# Patient Record
Sex: Female | Born: 1980 | Race: Black or African American | Hispanic: No | Marital: Single | Smoking: Current some day smoker
Health system: Southern US, Community
[De-identification: ages and names within clinical notes are randomized; demographics above are authoritative.]

## PROBLEM LIST (undated history)

## (undated) ENCOUNTER — Inpatient Hospital Stay: Payer: Self-pay

## (undated) DIAGNOSIS — F99 Mental disorder, not otherwise specified: Secondary | ICD-10-CM

## (undated) DIAGNOSIS — F329 Major depressive disorder, single episode, unspecified: Secondary | ICD-10-CM

## (undated) DIAGNOSIS — F32A Depression, unspecified: Secondary | ICD-10-CM

## (undated) DIAGNOSIS — F419 Anxiety disorder, unspecified: Secondary | ICD-10-CM

## (undated) DIAGNOSIS — D649 Anemia, unspecified: Secondary | ICD-10-CM

## (undated) HISTORY — DX: Anemia, unspecified: D64.9

## (undated) HISTORY — PX: SALPINGECTOMY: SHX328

---

## 2016-09-30 ENCOUNTER — Encounter (HOSPITAL_COMMUNITY): Payer: Self-pay | Admitting: Emergency Medicine

## 2016-09-30 ENCOUNTER — Emergency Department (HOSPITAL_COMMUNITY)
Admission: EM | Admit: 2016-09-30 | Discharge: 2016-09-30 | Disposition: A | Discharge status: Home / Self Care | Payer: Self-pay | Attending: Emergency Medicine | Admitting: Emergency Medicine

## 2016-09-30 DIAGNOSIS — Z7951 Long term (current) use of inhaled steroids: Secondary | ICD-10-CM | POA: Insufficient documentation

## 2016-09-30 DIAGNOSIS — Z79899 Other long term (current) drug therapy: Secondary | ICD-10-CM | POA: Insufficient documentation

## 2016-09-30 DIAGNOSIS — J45901 Unspecified asthma with (acute) exacerbation: Secondary | ICD-10-CM

## 2016-09-30 DIAGNOSIS — N739 Female pelvic inflammatory disease, unspecified: Secondary | ICD-10-CM

## 2016-09-30 DIAGNOSIS — F17200 Nicotine dependence, unspecified, uncomplicated: Secondary | ICD-10-CM | POA: Insufficient documentation

## 2016-09-30 LAB — URINALYSIS, ROUTINE W REFLEX MICROSCOPIC
Bilirubin Urine: NEGATIVE
GLUCOSE, UA: NEGATIVE mg/dL
Hgb urine dipstick: NEGATIVE
Ketones, ur: 20 mg/dL — AB
LEUKOCYTES UA: NEGATIVE
Nitrite: NEGATIVE
PH: 6 (ref 5.0–8.0)
Protein, ur: NEGATIVE mg/dL
Specific Gravity, Urine: 1.025 (ref 1.005–1.030)

## 2016-09-30 LAB — COMPREHENSIVE METABOLIC PANEL
ALT: 25 U/L (ref 14–54)
AST: 15 U/L (ref 15–41)
Albumin: 4 g/dL (ref 3.5–5.0)
Alkaline Phosphatase: 57 U/L (ref 38–126)
Anion gap: 7 (ref 5–15)
BUN: 6 mg/dL (ref 6–20)
CHLORIDE: 106 mmol/L (ref 101–111)
CO2: 25 mmol/L (ref 22–32)
Calcium: 8.7 mg/dL — ABNORMAL LOW (ref 8.9–10.3)
Creatinine, Ser: 0.69 mg/dL (ref 0.44–1.00)
GFR calc Af Amer: >60 mL/min (ref >60)
Glucose, Bld: 91 mg/dL (ref 65–99)
POTASSIUM: 3.3 mmol/L — AB (ref 3.5–5.1)
SODIUM: 138 mmol/L (ref 135–145)
Total Bilirubin: 0.8 mg/dL (ref 0.3–1.2)
Total Protein: 6.9 g/dL (ref 6.5–8.1)

## 2016-09-30 LAB — LIPASE, BLOOD: LIPASE: 24 U/L (ref 11–51)

## 2016-09-30 LAB — CBC
HEMATOCRIT: 33.9 % — AB (ref 36.0–46.0)
Hemoglobin: 12.1 g/dL (ref 12.0–15.0)
MCH: 31.1 pg (ref 26.0–34.0)
MCHC: 35.7 g/dL (ref 30.0–36.0)
MCV: 87.1 fL (ref 78.0–100.0)
Platelets: 268 10*3/uL (ref 150–400)
RBC: 3.89 MIL/uL (ref 3.87–5.11)
RDW: 12.8 % (ref 11.5–15.5)
WBC: 8.2 10*3/uL (ref 4.0–10.5)

## 2016-09-30 MED ORDER — ALBUTEROL SULFATE HFA 108 (90 BASE) MCG/ACT IN AERS: 2.0000 | INHALATION_SPRAY | Freq: Four times a day (QID) | RESPIRATORY_TRACT | 2 refills | Status: DC | PRN

## 2016-09-30 MED ORDER — PREDNISONE 10 MG PO TABS: 60.0000 mg | ORAL_TABLET | Freq: Every day | ORAL | 0 refills | Status: DC

## 2016-09-30 MED ORDER — PREDNISONE 20 MG PO TABS
60.0000 mg | ORAL_TABLET | Freq: Once | ORAL | Status: AC
  Administered 2016-09-30: 60 mg via ORAL
  Filled 2016-09-30: qty 3

## 2016-09-30 MED ORDER — ONDANSETRON 4 MG PO TBDP
4.0000 mg | ORAL_TABLET | Freq: Once | ORAL | Status: AC
  Administered 2016-09-30: 4 mg via ORAL

## 2016-09-30 MED ORDER — DOXYCYCLINE HYCLATE 100 MG PO CAPS: 100.0000 mg | ORAL_CAPSULE | Freq: Two times a day (BID) | ORAL | 0 refills | Status: DC

## 2016-09-30 MED ORDER — ONDANSETRON 4 MG PO TBDP
ORAL_TABLET | ORAL | Status: AC
  Filled 2016-09-30: qty 1

## 2016-09-30 MED ORDER — IPRATROPIUM-ALBUTEROL 0.5-2.5 (3) MG/3ML IN SOLN
5.0000 mL | Freq: Once | RESPIRATORY_TRACT | Status: AC
  Administered 2016-09-30: 5 mL via RESPIRATORY_TRACT
  Filled 2016-09-30: qty 3

## 2016-09-30 NOTE — Discharge Instructions (Signed)
Take the antibiotics prescribed - complete the course even if you get better as it will cover you for the lower body infection.  Protected sex only for now.  We saw you in the ER for your asthma related complains. We gave you some breathing treatments in the ER, and seems like your symptoms have improved. Please take albuterol as needed every 4 hours. Please take the medications prescribed. Please refrain from smoking or smoke exposure. Please see a primary care doctor in 1 week. Return to the ER if your symptoms worsen.

## 2016-09-30 NOTE — ED Triage Notes (Signed)
Patient here after ingesting Zithromax for STD earlier today, now with abdominal pain, feeling weak.  She states that she has had an URI.

## 2016-09-30 NOTE — ED Provider Notes (Signed)
[facility] Provider Note   CSN: [id number] Arrival date & time: 09/30/16  0240     History   Chief Complaint Chief Complaint  Patient presents with  . Abdominal Pain    HPI Jacqueline Diaz is a 36 y.o. female.  HPI  SUBJECTIVE:  Jacqueline Diaz is a 36 y.o. female seen urgently with exacerbation of asthma for 3 days. Wheezing is described as mild-moderate. Associated symptoms:congestion, nasal blockage, post nasal drip, productive cough, cough described as productive of yellow sputum and myalgias. Current asthma medications: Proventil MDI (or generic equivalent). Patient has smoke cigarettes.  Pt also c/o abd pain. She was diagnosed with STD and given azithromycin - but she threw it up. She has no new vaginal discharge, pain. Pt h  OBJECTIVE:  The patient appears alert, well appearing, and in no distress, oriented to person, place, and time, normal appearing weight and acyanotic, in no respiratory distress. ENT: ENT exam normal, no neck nodes or sinus tenderness CHEST:clear to auscultation, + wheezes, no rales or rhonchi, symmetric air entry  ASSESSMENT:  Asthma - acute exacerbation due to viral URI  History reviewed. No pertinent past medical history.  There are no active problems to display for this patient.   History reviewed. No pertinent surgical history.  OB History    No data available       Home Medications    Prior to Admission medications   Medication Sig Start Date End Date Taking? Authorizing Provider  albuterol (PROVENTIL HFA;VENTOLIN HFA) 108 (90 Base) MCG/ACT inhaler Inhale 2 puffs into the lungs every 6 (six) hours as needed for wheezing or shortness of breath. 09/30/16   Derwood Kaplan, MD  doxycycline (VIBRAMYCIN) 100 MG capsule Take 1 capsule (100 mg total) by mouth 2 (two) times daily. 09/30/16   Derwood Kaplan, MD  predniSONE (DELTASONE) 10 MG tablet Take 6 tablets (60 mg total) by mouth daily. 09/30/16   Derwood Kaplan, MD     Family History No family history on file.  Social History Social History  Substance Use Topics  . Smoking status: Current Some Day Smoker  . Smokeless tobacco: Never Used  . Alcohol use No     Allergies   Flagyl [metronidazole] and Morphine and related   Review of Systems Review of Systems  HENT: Positive for sinus pressure and voice change.   Respiratory: Positive for cough, shortness of breath and wheezing.   Gastrointestinal: Positive for abdominal pain.  Allergic/Immunologic: Negative for immunocompromised state.  All other systems reviewed and are negative.    Physical Exam Updated Vital Signs BP 125/84 (BP Location: Right Arm)   Pulse 91   Temp 98.5 F (36.9 C) (Oral)   Resp 16   Ht 5\' 3"  (1.6 m)   Wt 71.7 kg (158 lb)   LMP 09/22/2016 (Approximate)   SpO2 100%   BMI 27.99 kg/m   Physical Exam  Constitutional: She is oriented to person, place, and time. She appears well-developed and well-nourished.  HENT:  Head: Normocephalic and atraumatic.  Eyes: EOM are normal. Pupils are equal, round, and reactive to light.  Neck: Neck supple.  Cardiovascular: Normal rate, regular rhythm and normal heart sounds.   No murmur heard. Pulmonary/Chest: Effort normal. No respiratory distress. She has wheezes.  Abdominal: Soft. She exhibits no distension. There is no tenderness. There is no rebound and no guarding.  Neurological: She is alert and oriented to person, place, and time.  Skin: Skin is warm and dry.  Nursing note and vitals  reviewed.    ED Treatments / Results  Labs (all labs ordered are listed, but only abnormal results are displayed) Labs Reviewed  COMPREHENSIVE METABOLIC PANEL - Abnormal; Notable for the following:       Result Value   Potassium 3.3 (*)    Calcium 8.7 (*)    All other components within normal limits  CBC - Abnormal; Notable for the following:    HCT 33.9 (*)    All other components within normal limits  URINALYSIS, ROUTINE W  REFLEX MICROSCOPIC - Abnormal; Notable for the following:    APPearance HAZY (*)    Ketones, ur 20 (*)    All other components within normal limits  LIPASE, BLOOD    EKG  EKG Interpretation None       Radiology No results found.  Procedures Procedures (including critical care time)  Medications Ordered in ED Medications  ondansetron (ZOFRAN-ODT) disintegrating tablet 4 mg (4 mg Oral Given 09/30/16 0304)  ipratropium-albuterol (DUONEB) 0.5-2.5 (3) MG/3ML nebulizer solution 5 mL (5 mLs Nebulization Given 09/30/16 0643)  predniSONE (DELTASONE) tablet 60 mg (60 mg Oral Given 09/30/16 [id number])     Initial Impression / Assessment and Plan / ED Course  I have reviewed the triage vital signs and the nursing notes.  Pertinent labs & imaging results that were available during my care of the patient were reviewed by me and considered in my medical decision making (see chart for details).       Final Clinical Impressions(s) / ED Diagnoses   Final diagnoses:  Moderate asthma with exacerbation, unspecified whether persistent  Pelvic infection in female    New Prescriptions New Prescriptions   ALBUTEROL (PROVENTIL HFA;VENTOLIN HFA) 108 (90 BASE) MCG/ACT INHALER    Inhale 2 puffs into the lungs every 6 (six) hours as needed for wheezing or shortness of breath.   DOXYCYCLINE (VIBRAMYCIN) 100 MG CAPSULE    Take 1 capsule (100 mg total) by mouth 2 (two) times daily.   PREDNISONE (DELTASONE) 10 MG TABLET    Take 6 tablets (60 mg total) by mouth daily.     Derwood KaplanNanavati, Shayleen Eppinger, MD 09/30/16 [phone]

## 2016-09-30 NOTE — ED Notes (Signed)
Pt given 2 ginger ales, one for her and for her man visitor.

## 2017-07-08 ENCOUNTER — Other Ambulatory Visit: Payer: Self-pay

## 2017-07-08 ENCOUNTER — Inpatient Hospital Stay
Admission: EM | Admit: 2017-07-08 | Discharge: 2017-07-08 | Disposition: A | Discharge status: Other Acute Inpatient Hospital | Payer: Medicaid Other | Attending: Obstetrics and Gynecology | Admitting: Obstetrics and Gynecology

## 2017-07-08 ENCOUNTER — Inpatient Hospital Stay
Admission: AD | Admit: 2017-07-08 | Discharge: 2017-07-09 | DRG: 832 | Disposition: A | Discharge status: Home / Self Care | Payer: No Typology Code available for payment source | Source: Intra-hospital | Attending: Psychiatry | Admitting: Psychiatry

## 2017-07-08 DIAGNOSIS — G4700 Insomnia, unspecified: Secondary | ICD-10-CM | POA: Diagnosis present

## 2017-07-08 DIAGNOSIS — R45851 Suicidal ideations: Secondary | ICD-10-CM | POA: Diagnosis not present

## 2017-07-08 DIAGNOSIS — O98313 Other infections with a predominantly sexual mode of transmission complicating pregnancy, third trimester: Secondary | ICD-10-CM | POA: Diagnosis not present

## 2017-07-08 DIAGNOSIS — Z62810 Personal history of physical and sexual abuse in childhood: Secondary | ICD-10-CM | POA: Diagnosis present

## 2017-07-08 DIAGNOSIS — O26893 Other specified pregnancy related conditions, third trimester: Secondary | ICD-10-CM | POA: Diagnosis not present

## 2017-07-08 DIAGNOSIS — F419 Anxiety disorder, unspecified: Secondary | ICD-10-CM | POA: Diagnosis present

## 2017-07-08 DIAGNOSIS — Z888 Allergy status to other drugs, medicaments and biological substances status: Secondary | ICD-10-CM

## 2017-07-08 DIAGNOSIS — A599 Trichomoniasis, unspecified: Secondary | ICD-10-CM | POA: Insufficient documentation

## 2017-07-08 DIAGNOSIS — O09213 Supervision of pregnancy with history of pre-term labor, third trimester: Secondary | ICD-10-CM | POA: Insufficient documentation

## 2017-07-08 DIAGNOSIS — O99333 Smoking (tobacco) complicating pregnancy, third trimester: Secondary | ICD-10-CM | POA: Insufficient documentation

## 2017-07-08 DIAGNOSIS — O09523 Supervision of elderly multigravida, third trimester: Secondary | ICD-10-CM | POA: Insufficient documentation

## 2017-07-08 DIAGNOSIS — R1030 Lower abdominal pain, unspecified: Secondary | ICD-10-CM | POA: Diagnosis present

## 2017-07-08 DIAGNOSIS — F333 Major depressive disorder, recurrent, severe with psychotic symptoms: Secondary | ICD-10-CM | POA: Diagnosis present

## 2017-07-08 DIAGNOSIS — O99343 Other mental disorders complicating pregnancy, third trimester: Principal | ICD-10-CM | POA: Diagnosis present

## 2017-07-08 DIAGNOSIS — Z915 Personal history of self-harm: Secondary | ICD-10-CM | POA: Diagnosis not present

## 2017-07-08 DIAGNOSIS — Z885 Allergy status to narcotic agent status: Secondary | ICD-10-CM

## 2017-07-08 DIAGNOSIS — D649 Anemia, unspecified: Secondary | ICD-10-CM | POA: Insufficient documentation

## 2017-07-08 DIAGNOSIS — Z87440 Personal history of urinary (tract) infections: Secondary | ICD-10-CM | POA: Insufficient documentation

## 2017-07-08 DIAGNOSIS — O99353 Diseases of the nervous system complicating pregnancy, third trimester: Secondary | ICD-10-CM | POA: Diagnosis present

## 2017-07-08 DIAGNOSIS — O99013 Anemia complicating pregnancy, third trimester: Secondary | ICD-10-CM | POA: Diagnosis not present

## 2017-07-08 DIAGNOSIS — F17200 Nicotine dependence, unspecified, uncomplicated: Secondary | ICD-10-CM | POA: Diagnosis present

## 2017-07-08 DIAGNOSIS — A5901 Trichomonal vulvovaginitis: Secondary | ICD-10-CM

## 2017-07-08 DIAGNOSIS — Z3A29 29 weeks gestation of pregnancy: Secondary | ICD-10-CM | POA: Insufficient documentation

## 2017-07-08 DIAGNOSIS — O0933 Supervision of pregnancy with insufficient antenatal care, third trimester: Secondary | ICD-10-CM | POA: Diagnosis not present

## 2017-07-08 DIAGNOSIS — R109 Unspecified abdominal pain: Secondary | ICD-10-CM

## 2017-07-08 DIAGNOSIS — Z818 Family history of other mental and behavioral disorders: Secondary | ICD-10-CM | POA: Diagnosis not present

## 2017-07-08 DIAGNOSIS — Z813 Family history of other psychoactive substance abuse and dependence: Secondary | ICD-10-CM | POA: Diagnosis not present

## 2017-07-08 HISTORY — DX: Mental disorder, not otherwise specified: F99

## 2017-07-08 HISTORY — DX: Major depressive disorder, single episode, unspecified: F32.9

## 2017-07-08 HISTORY — DX: Anxiety disorder, unspecified: F41.9

## 2017-07-08 HISTORY — DX: Depression, unspecified: F32.A

## 2017-07-08 LAB — URINALYSIS, COMPLETE (UACMP) WITH MICROSCOPIC
BACTERIA UA: NONE SEEN
Bilirubin Urine: NEGATIVE
Glucose, UA: NEGATIVE mg/dL
Hgb urine dipstick: NEGATIVE
KETONES UR: NEGATIVE mg/dL
NITRITE: NEGATIVE
PH: 7 (ref 5.0–8.0)
Protein, ur: NEGATIVE mg/dL
SPECIFIC GRAVITY, URINE: 1.013 (ref 1.005–1.030)

## 2017-07-08 LAB — URINE DRUG SCREEN, QUALITATIVE (ARMC ONLY)
AMPHETAMINES, UR SCREEN: NOT DETECTED
BENZODIAZEPINE, UR SCRN: NOT DETECTED
Barbiturates, Ur Screen: NOT DETECTED
Cannabinoid 50 Ng, Ur ~~LOC~~: POSITIVE — AB
Cocaine Metabolite,Ur ~~LOC~~: NOT DETECTED
MDMA (Ecstasy)Ur Screen: NOT DETECTED
METHADONE SCREEN, URINE: NOT DETECTED
Opiate, Ur Screen: NOT DETECTED
Phencyclidine (PCP) Ur S: NOT DETECTED
Tricyclic, Ur Screen: NOT DETECTED

## 2017-07-08 LAB — CBC WITH DIFFERENTIAL/PLATELET
BASOS ABS: 0 10*3/uL (ref 0–0.1)
Basophils Relative: 0 %
EOS PCT: 2 %
Eosinophils Absolute: 0.1 10*3/uL (ref 0–0.7)
HEMATOCRIT: 28.4 % — AB (ref 35.0–47.0)
HEMOGLOBIN: 9.8 g/dL — AB (ref 12.0–16.0)
LYMPHS ABS: 1.5 10*3/uL (ref 1.0–3.6)
LYMPHS PCT: 18 %
MCH: 32.7 pg (ref 26.0–34.0)
MCHC: 34.6 g/dL (ref 32.0–36.0)
MCV: 94.5 fL (ref 80.0–100.0)
Monocytes Absolute: 0.6 10*3/uL (ref 0.2–0.9)
Monocytes Relative: 8 %
NEUTROS ABS: 6.1 10*3/uL (ref 1.4–6.5)
NEUTROS PCT: 72 %
PLATELETS: 219 10*3/uL (ref 150–440)
RBC: 3 MIL/uL — AB (ref 3.80–5.20)
RDW: 12.5 % (ref 11.5–14.5)
WBC: 8.4 10*3/uL (ref 3.6–11.0)

## 2017-07-08 LAB — CHLAMYDIA/NGC RT PCR (ARMC ONLY)
CHLAMYDIA TR: NOT DETECTED
N GONORRHOEAE: NOT DETECTED

## 2017-07-08 LAB — HEMOGLOBIN A1C
Hgb A1c MFr Bld: 3.6 % — ABNORMAL LOW (ref 4.8–5.6)
Mean Plasma Glucose: 56.62 mg/dL

## 2017-07-08 MED ORDER — PRENATAL MULTIVITAMIN CH
1.0000 | ORAL_TABLET | Freq: Every day | ORAL | Status: DC
  Administered 2017-07-09: 1 via ORAL
  Filled 2017-07-08: qty 1

## 2017-07-08 MED ORDER — QUETIAPINE FUMARATE 25 MG PO TABS
50.0000 mg | ORAL_TABLET | Freq: Every day | ORAL | Status: DC
  Administered 2017-07-08: 50 mg via ORAL
  Filled 2017-07-08: qty 2

## 2017-07-08 MED ORDER — CALCIUM CARBONATE ANTACID 500 MG PO CHEW
CHEWABLE_TABLET | ORAL | Status: AC
  Filled 2017-07-08: qty 2

## 2017-07-08 MED ORDER — METRONIDAZOLE 500 MG PO TABS
2000.0000 mg | ORAL_TABLET | Freq: Once | ORAL | Status: AC
  Administered 2017-07-08: 2000 mg via ORAL
  Filled 2017-07-08: qty 4

## 2017-07-08 MED ORDER — CALCIUM CARBONATE ANTACID 500 MG PO CHEW
2.0000 | CHEWABLE_TABLET | Freq: Every day | ORAL | Status: DC
  Administered 2017-07-08: 400 mg via ORAL

## 2017-07-08 NOTE — Progress Notes (Signed)
Doppler fetal heart tone obtained, 144bpm. Will report to [facility] nurse.

## 2017-07-08 NOTE — Progress Notes (Signed)
Alan Mulderalled Stephanie, [facility] to [facility] and possible need of sitter in observation room on labor and delivery unit if patient is not to be admitted this shift due to patient presenting with SI and extensive mental health history. Judeth CornfieldStephanie, [facility] returned phone call regarding [facility] is ready for the admission. Placed phone call to BMU [phone]), no answer at this time.
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
## 2017-07-08 NOTE — Progress Notes (Signed)
[facility] responded to an order requisition for suicidal thoughts. [facility] arrived and patient was sitting in a dark room curled up in the bed. Patient stated that she depressed and dealing with loss and grieving. Patient thought about having an abortion as the father is not in the picture any longer. Patient's daughter is also pregnant and due a month before. Patient stated that this is not the right time as she is living with her sister and needs to figure her living situation out and get "right". CH provided emotional support, listening ear, and prayer. Patient wants to be left alone and stay one night on other unit before needing a way to get back to her sisters. Patient is staying in [place] but is from [place] area.

## 2017-07-08 NOTE — Progress Notes (Signed)
Report called to Trinna PostAlex, RN [facility]. Nurse verbalized understanding of report given. Pt to be admitted voluntarily to [facility]. Will transport patient via wheelchair to the Behavioral Medicine Unit.

## 2017-07-08 NOTE — Clinical Social Work Note (Signed)
CSW received consult for multiple resource needs. Psychiatry consult was placed due to patient admitting to suicidal ideation. CSW will defer assessment until psychiatry has seen patient. York Spaniel MSW,LCSW [phone]

## 2017-07-08 NOTE — Tx Team (Signed)
Initial Treatment Plan 07/08/2017 11:46 PM Jacqueline AweLatosha Domingos ZOX:[id number]    PATIENT STRESSORS: Financial difficulties Health problems Marital or family conflict Substance abuse   PATIENT STRENGTHS: Capable of independent living Communication skills Motivation for treatment/growth Religious Affiliation   PATIENT IDENTIFIED PROBLEMS: Depression/Anxiety    Bipolar , Schizophrenia    Suicidal     Marijuana use         DISCHARGE CRITERIA:  Adequate post-discharge living arrangements Improved stabilization in mood, thinking, and/or behavior Medical problems require only outpatient monitoring Motivation to continue treatment in a less acute level of care Need for constant or close observation no longer present Reduction of life-threatening or endangering symptoms to within safe limits  PRELIMINARY DISCHARGE PLAN: Outpatient therapy Participate in family therapy Placement in alternative living arrangements Return to previous living arrangement  PATIENT/FAMILY INVOLVEMENT: This treatment plan has been presented to and reviewed with the patient, Jacqueline Diaz,.  The patient  have been given the opportunity to ask questions and make suggestions.  Lelan PonsAlexander  Markelle Asaro, RN 07/08/2017, 11:46 PM

## 2017-07-08 NOTE — Clinical Social Work Note (Signed)
Patient's nurse contacted CSW and informed that psychiatry has seen patient and patient will be [facility]. [place]. [place] SpanielMonica Langdon Crosson MSW,LCSW [phone]

## 2017-07-08 NOTE — Progress Notes (Addendum)
Pt arrives to unit via EMS. Pt presents with abdominal pain q265min, vaginal irritation, and L sided lower back pain.   Triaging patient and patient begins to cry and states that she is depressed. I sit with the patient and listen to her concerns and worries/what makes her depressed. The patient spoke to me and said that she was stressed and overwhelmed. She stated that she was sad because she lost custody of her two youngest kids in [id number] when she was in prison. She states she was in prison for "threatening her social worker". She said she was there 5 months where she was diagnosed with syphilis and treated with 3 doses of PCN. She states "her oldest 2 children are 5816 and 39 years old. The 39 year old lives in [place] with his dad and the 39 year old live in [place] and is pregnant too". States that she is also sad because she has no support other than her mother, who lives in [place], and her dad passed away in 2013. I asked patient if she had any thoughts of harming herself and she said "Yes, I have felt like that the past several days". When I asked if she had a thought, plan, or attempt to commit suicide, she stated that she had thoughts of harming herself. When I asked if she had a plan, she said that she wanted to "cut herself and bleed out". I [facility] and put in a psych consult. Spoke to Dr. Johnella MoloneyMcNew, MD for psych and discussed plan of care.  She moved from [place] to [place] and currently lives with her sister, who works in a nursing facility. She states she has not had prenatal care since she left [place] at 16 weeks. She was getting [facility] at [facility]. She states she doesn't really know why she hasn't reached out to receive care here in [place]. She stated that she delivered all of her other children there and they were all vaginal deliveries.   I asked patient if she had transportation, food, etc. She stated she "got groceries when she first moved here". I asked if  she had gotten groceries since then, she said she got some a while ago from her mom when she came to [place] area. Pt states she has no transportation anywhere.  Pt states she has "depression, anxiety, bipolar disorder, and schizophrenia". Pt states she was told she was anemic and would need iron transfusions this pregnancy. Pt states she has had hx of syphilis, trichomonas, and was recently hospitalized [facility]. Pt states she received many antibiotics while in the hospital, but has not resumed taking her antibiotics at home.   During assessment, pt complains of vaginal irritation-- itching and "fishy odor". She states she had sex last week and that symptoms showed up several days ago. Provider performed wet prep and pt was diagnosed with trichomonas and bacterial vaginosis. Provider ordered Flagyl 2000 mg. Pt states she has had Flagyl in past and did not have reaction like she once did. Pt complains of left, lower back pain. Provider assessed and sent U/A. Labs came back WNL. Pt states she smoked marijuana yesterday and used cocaine prior to pregnancy. UDS positive for maijuana. Provider has been at bedside and educated patient regarding plan of care.

## 2017-07-08 NOTE — Progress Notes (Signed)
Called [facility] and spoke to charge nurse per MD request. Charge nurse states they would come get pt [facility] at the beginning of the next shift.

## 2017-07-08 NOTE — BH Assessment (Signed)
Patient is to be admitted to [facility] by Dr. Johnella MoloneyMcNew.  Attending Physician will be Dr. Johnella MoloneyMcNew.   Patient has been assigned to room 310, by [facility] Nurse Gwen.   Intake Paper Work has been signed and placed on patient chart.    Jacqueline Diaz, Patient's Nurse   Mertie ClauseJeanelle, Patient Access.

## 2017-07-08 NOTE — Discharge Summary (Signed)
L&D OB Triage Note  SUBJECTIVE Jacqueline Diaz is a 37 y.o. [id number] female at 1721w1d, EDD Estimated Date of Delivery: 09/22/17 who presented to triage with complaints of lower abdominal pain, vaginal discharge with irritation, no prenatal care for the last 15 weeks.  She was originally [facility] at [facility] but has now moved to this area and has not had any prenatal care or sought prenatal care.  Her obstetric history is complicated by a prior preterm birth.  Within the last year she has been treated for syphilis and trichomonas.  She was hospitalized during this pregnancy for pyelonephritis but admits she is not currently taking her prescribed antibiotics.  She admits to the use of marijuana.  Upon further discussion she has a difficult living situation, lack of food to eat and transportation issues restricting her care.  She also admits to thoughts of self-harm and have worsened in the last few days..   OB History  Gravida Para Term Preterm AB Living  6 4 3 1 1 4   SAB TAB Ectopic Multiple Live Births  0 0 1 0 4    # Outcome Date GA Lbr Len/2nd Weight Sex Delivery Anes PTL Lv  6 Current           5 Preterm 02/15/13     Vag-Spont   LIV  4 Term 04/18/11    F Vag-Spont   LIV  3 Ectopic 2007          2 Term 04/03/01   8 lb 2 oz (3.685 kg)  Vag-Spont   LIV  1 Term 09/1995     Vag-Spont   LIV    Medications Prior to Admission  Medication Sig Dispense Refill Last Dose  . Prenatal Vit-Fe Fumarate-FA (MULTIVITAMIN-PRENATAL) 27-0.8 MG TABS tablet Take 1 tablet by mouth daily at 12 noon.   Past Week at Unknown time  . albuterol (PROVENTIL HFA;VENTOLIN HFA) 108 (90 Base) MCG/ACT inhaler Inhale 2 puffs into the lungs every 6 (six) hours as needed for wheezing or shortness of breath. (Patient not taking: Reported on 07/08/2017) 1 Inhaler 2 Not Taking at Unknown time  . doxycycline (VIBRAMYCIN) 100 MG capsule Take 1 capsule (100 mg total) by mouth 2 (two) times daily. (Patient not taking: Reported on  07/08/2017) 14 capsule 0 Not Taking at Unknown time  . predniSONE (DELTASONE) 10 MG tablet Take 6 tablets (60 mg total) by mouth daily. (Patient not taking: Reported on 07/08/2017) 18 tablet 0 Not Taking at Unknown time        OBJECTIVE  Nursing Evaluation:   BP (!) 97/58 (BP Location: Left Arm)   Pulse 86   Temp 98.2 F (36.8 C) (Oral)   Resp 18   Ht 5\' 4"  (1.626 m)   Wt 159 lb (72.1 kg)   LMP 09/22/2016 (Approximate)   BMI 27.29 kg/m    Findings:   U/A -no evidence of UTI.     Tox screen positive for MJ     Wet prep showing trichomonas and BV     Patient with rare irregular contractions-not in labor.     Anemia     RPR and GC/CT hemoglobin A1c pending.  NST was performed and has been reviewed by me.  NST INTERPRETATION: Category I  Mode: External Baseline Rate (A): 155 bpm(fht) Variability: Moderate Accelerations: 10 x 10 Decelerations: Variable     Contraction Frequency (min): 10-11  WET PREP: clue cells: present, KOH (yeast): negative, odor: present and trichomoniasis: positive  ASSESSMENT Impression:  1.  Pregnancy:  [id number] at 109w1d , EDD Estimated Date of Delivery: 09/22/17   -  history of preterm birth-  2.  NST:  Category I   consistent with gestational age. 3.  History of  pyelonephritis during pregnancy not on prophylaxis. 4.  Trichomonas 5.  Patient with self-harm/suicidal ideation. 6.  No prenatal care in the second trimester. 7.  Anemia 8.  Recent incarceration 9.  History of positive RPR with treatment documented in the last year without subsequent test of cure.  PLAN 1.   A psychiatry consult was obtained and they agreed that self-harm was a possibility.  Once she is discharged from labor and delivery she will be direct admitted to psychiatry.  The patient has agreed to this admission. 2.  Stat 2 g dose of Flagyl for trichomoniasis and BV given. 3.  Recommend  oral iron twice daily with meals. 4.  Macrobid 1 p.o. daily for the remainder of  the pregnancy for prophylaxis - history of pyelonephritis 5.  Prenatal vitamins daily 6.  Check RPR, GC/CT, hemoglobin A1c during this admission. 7.  Resume prenatal care when discharged   ?[facility] 8.  Recommend social service consult prior to discharge   I spent 45 minutes with this patient of which greater than 50% was spent in direct patient care in labor and delivery unit.  This included a review of her history and discussion with her about this history, recommendation for future care, evaluation of self-harm, discussion regarding STDs.

## 2017-07-09 DIAGNOSIS — F333 Major depressive disorder, recurrent, severe with psychotic symptoms: Secondary | ICD-10-CM

## 2017-07-09 LAB — COMPREHENSIVE METABOLIC PANEL
ALBUMIN: 3.2 g/dL — AB (ref 3.5–5.0)
ALT: 11 U/L — ABNORMAL LOW (ref 14–54)
AST: 15 U/L (ref 15–41)
Alkaline Phosphatase: 58 U/L (ref 38–126)
Anion gap: 8 (ref 5–15)
BILIRUBIN TOTAL: 0.6 mg/dL (ref 0.3–1.2)
BUN: 6 mg/dL (ref 6–20)
CO2: 24 mmol/L (ref 22–32)
Calcium: 8.6 mg/dL — ABNORMAL LOW (ref 8.9–10.3)
Chloride: 104 mmol/L (ref 101–111)
Creatinine, Ser: 0.4 mg/dL — ABNORMAL LOW (ref 0.44–1.00)
GFR calc Af Amer: >60 mL/min (ref >60)
GFR calc non Af Amer: >60 mL/min (ref >60)
GLUCOSE: 81 mg/dL (ref 65–99)
Potassium: 3.5 mmol/L (ref 3.5–5.1)
Sodium: 136 mmol/L (ref 135–145)
TOTAL PROTEIN: 6.2 g/dL — AB (ref 6.5–8.1)

## 2017-07-09 LAB — RPR: RPR Ser Ql: NONREACTIVE

## 2017-07-09 LAB — LIPID PANEL
CHOLESTEROL: 190 mg/dL (ref 0–200)
HDL: 42 mg/dL (ref >40)
LDL Cholesterol: 118 mg/dL — ABNORMAL HIGH (ref 0–99)
TRIGLYCERIDES: 152 mg/dL — AB (ref <150)
Total CHOL/HDL Ratio: 4.5 RATIO
VLDL: 30 mg/dL (ref 0–40)

## 2017-07-09 LAB — CBC
HEMATOCRIT: 29.7 % — AB (ref 35.0–47.0)
Hemoglobin: 10.5 g/dL — ABNORMAL LOW (ref 12.0–16.0)
MCH: 33.1 pg (ref 26.0–34.0)
MCHC: 35.3 g/dL (ref 32.0–36.0)
MCV: 93.8 fL (ref 80.0–100.0)
Platelets: 238 10*3/uL (ref 150–440)
RBC: 3.16 MIL/uL — ABNORMAL LOW (ref 3.80–5.20)
RDW: 12.2 % (ref 11.5–14.5)
WBC: 8.8 10*3/uL (ref 3.6–11.0)

## 2017-07-09 LAB — HEMOGLOBIN A1C
HEMOGLOBIN A1C: 3.6 % — AB (ref 4.8–5.6)
Mean Plasma Glucose: 56.62 mg/dL

## 2017-07-09 LAB — TSH: TSH: 1.093 u[IU]/mL (ref 0.350–4.500)

## 2017-07-09 MED ORDER — NITROFURANTOIN MONOHYD MACRO 100 MG PO CAPS: 100.0000 mg | ORAL_CAPSULE | Freq: Every day | ORAL | Status: AC

## 2017-07-09 MED ORDER — FERROUS SULFATE 325 (65 FE) MG PO TABS: 325.0000 mg | ORAL_TABLET | Freq: Two times a day (BID) | ORAL | 0 refills | Status: AC

## 2017-07-09 MED ORDER — FERROUS SULFATE 325 (65 FE) MG PO TABS
325.0000 mg | ORAL_TABLET | Freq: Two times a day (BID) | ORAL | Status: DC
  Administered 2017-07-09: 325 mg via ORAL
  Filled 2017-07-09: qty 1

## 2017-07-09 MED ORDER — QUETIAPINE FUMARATE 50 MG PO TABS: 50.0000 mg | ORAL_TABLET | Freq: Every day | ORAL | 1 refills | Status: AC

## 2017-07-09 MED ORDER — NITROFURANTOIN MONOHYD MACRO 100 MG PO CAPS: 100.0000 mg | ORAL_CAPSULE | Freq: Every day | ORAL | Status: DC

## 2017-07-09 MED ORDER — FERROUS SULFATE 325 (65 FE) MG PO TABS: 325.0000 mg | ORAL_TABLET | Freq: Two times a day (BID) | ORAL | 1 refills | Status: DC

## 2017-07-09 NOTE — Discharge Summary (Signed)
Physician Discharge Summary Note  Patient:  Jacqueline Diaz is an 37 y.o., female MRN:  [id number] DOB:  [birth year] Patient phone:  [phone] (home)  Patient address:   [address] [place] [address],  Total Time spent with patient: 45 minutes  Date of Admission:  07/08/2017 Date of Discharge: 07/09/17  Reason for Admission:  Depression  Principal Problem: MDD (major depressive disorder), recurrent, severe, with psychosis [facility]) Discharge Diagnoses: Patient Active Problem List   Diagnosis Date Noted  . MDD (major depressive disorder), recurrent, severe, with psychosis (HCC) [F33.3] 07/08/2017    Priority: High    Past Psychiatric History: See H&P  Past Medical History:  Past Medical History:  Diagnosis Date  . Anxiety   . Depression   . Mental disorder    History reviewed. No pertinent surgical history. Family History: History reviewed. No pertinent family history. Family Psychiatric  History: See H&P Social History:  Social History   Substance and Sexual Activity  Alcohol Use No     Social History   Substance and Sexual Activity  Drug Use Yes  . Types: Marijuana   Comment: last used 07/07/17    Social History   Socioeconomic History  . Marital status: Single    Spouse name: Not on file  . Number of children: Not on file  . Years of education: Not on file  . Highest education level: Not on file  Occupational History  . Not on file  Social Needs  . Financial resource strain: Not on file  . Food insecurity:    Worry: Not on file    Inability: Not on file  . Transportation needs:    Medical: Not on file    Non-medical: Not on file  Tobacco Use  . Smoking status: Current Some Day Smoker  . Smokeless tobacco: Never Used  Substance and Sexual Activity  . Alcohol use: No  . Drug use: Yes    Types: Marijuana    Comment: last used 07/07/17  . Sexual activity: Yes  Lifestyle  . Physical activity:    Days per week: Not on file    Minutes per  session: Not on file  . Stress: Not on file  Relationships  . Social connections:    Talks on phone: Not on file    Gets together: Not on file    Attends religious service: Not on file    Active member of club or organization: Not on file    Attends meetings of clubs or organizations: Not on file    Relationship status: Not on file  Other Topics Concern  . Not on file  Social History Narrative  . Not on file    Hospital Course:  Pt was started on Seroquel 50 mg qhs for mood stabilization, racing thoughts, depression and insomnia. Discussed the risks and benefits of treating depression during pregnancy and untreated mood symptoms can be detrimental to the fetus. She is adamant that she does want to be on medications. The benefits do outweigh the risks of starting medications for mood.  The next day she felt much better after getting a great night's rest. Her affect does appear brighter today than I saw her yesterday. No longer tearful, much more hopeful for the baby and the future. She is future oriented and plans to make appointments for prenatal care in [place], follow up with mental health, she wants to try to get her kids back by doing the things she needs to like get a  job and housing. She requests discharge today. She denies SI or any thoughts of harming herself. Denies any thoughts of harming the fetus. She does appear much less depressed today. She plans to stay with her sister. She requests 7 day supply of her medications so she has time to fill her scripts in the next few days. I feel that keeping her in the hospital against her will would do more harm than good. It would likely prevent her from seeking mental health services when needed for fear that she would be kept against her will. She is adamant that she will return to the ED if she were to feel unsafe. She states, "Oh yes I would definitely call the police." She wants her boyfriend to pick her up today. She does not meet IVC  criteria at this time.  She does not appear psychotic or manic. Denies AH or VH.   The patient is at low risk of imminent suicide. Patient denied thoughts, intent, or plan for harm to self or others, expressed significant future orientation, and expressed an ability to mobilize assistance for her needs. She is presently void of any contributing psychiatric symptoms, cognitive difficulties, or substance use which would elevate her risk for lethality. Chronic risk for lethality is elevated in light of history of abuse and trauma, impulsivity, substance abuse. The chronic risk is presently mitigated by her ongoing desire and engagement in [facility] treatment and mobilization of support from family and friends. Chronic risk may elevate if she experiences any significant loss or worsening of symptoms, which can be managed and monitored through outpatient providers. At this time,a cute risk for lethality is low and she is stable for ongoing outpatient management.   Modifiable risk factors were addressed during this hospitalization through appropriate pharmacotherapy and establishment of outpatient follow-up treatment. Some risk factors for suicide are situational (i.e. Unstable housing) or related personality pathology (i.e. Poor coping mechanisms) and thus cannot be further mitigated by continued hospitalization in this setting.    Musculoskeletal: Strength & Muscle Tone: within normal limits Gait & Station: normal Patient leans: N/A  Psychiatric Specialty Exam: Physical Exam  Nursing note and vitals reviewed.   ROS  Blood pressure 95/64, pulse 89, temperature 98.8 F (37.1 C), temperature source Oral, resp. rate 18, height 5\' 8"  (1.727 m), weight 72.1 kg (159 lb), last menstrual period 09/22/2016, SpO2 97 %.Body mass index is 24.18 kg/m.  General Appearance: Casual  Eye Contact:  Fair  Speech:  Clear and Coherent  Volume:  Normal  Mood:  Depressed  Affect:  Constricted but much brighter today   Thought Process:  Coherent and Goal Directed  Orientation:  Full (Time, Place, and Person)  Thought Content:  Logical  Suicidal Thoughts:  No  Homicidal Thoughts:  No  Memory:  Immediate;   Fair  Judgement:  Fair  Insight:  Fair  Psychomotor Activity:  Normal  Concentration:  Concentration: Fair  Recall:  [employer]Fair  Fund of Knowledge:  Fair  Language:  Fair  Akathisia:  No      Assets:  Communication Skills Desire for Improvement Resilience  ADL's:  Intact  Cognition:  WNL  Sleep:  Number of Hours: 7      Physical Findings: AIMS: Facial and Oral Movements Muscles of Facial Expression: None, normal Lips and Perioral Area: None, normal Jaw: None, normal Tongue: None, normal,Extremity Movements Upper (arms, wrists, hands, fingers): None, normal Lower (legs, knees, ankles, toes): None, normal, Trunk Movements Neck, shoulders, hips: None, normal, Overall  Severity Severity of abnormal movements (highest score from questions above): None, normal Incapacitation due to abnormal movements: None, normal Patient's awareness of abnormal movements (rate only patient's report): No Awareness, Dental Status Current problems with teeth and/or dentures?: No Does patient usually wear dentures?: No  CIWA:  CIWA-Ar Total: 6 COWS:  COWS Total Score: 2  Have you used any form of tobacco in the last 30 days? (Cigarettes, Smokeless Tobacco, Cigars, and/or Pipes): No  Has this patient used any form of tobacco in the last 30 days? (Cigarettes, Smokeless Tobacco, Cigars, and/or Pipes) No  Blood Alcohol level:  No results found for: [facility]  Metabolic Disorder Labs:  Lab Results  Component Value Date   HGBA1C 3.6 (L) 07/08/2017   MPG 56.62 07/08/2017   No results found for: PROLACTIN Lab Results  Component Value Date   CHOL 190 07/09/2017   TRIG 152 (H) 07/09/2017   HDL 42 07/09/2017   CHOLHDL 4.5 07/09/2017   VLDL 30 07/09/2017   LDLCALC 118 (H) 07/09/2017    See Psychiatric Specialty  Exam and Suicide Risk Assessment completed by Attending Physician prior to discharge.  Discharge destination:  Home  Is patient on multiple antipsychotic therapies at discharge:  No   Has Patient had three or more failed trials of antipsychotic monotherapy by history:  No  Recommended Plan for Multiple Antipsychotic Therapies: NA  Discharge Instructions    Call MD for:  persistant nausea and vomiting   Complete by:  As directed    Call MD for:  severe uncontrolled pain   Complete by:  As directed    Increase activity slowly   Complete by:  As directed      Allergies as of 07/09/2017      Reactions   Flagyl [metronidazole]    Has taken flagyl since June 2018 and has not had a reaction   Morphine And Related       Medication List    STOP taking these medications   albuterol 108 (90 Base) MCG/ACT inhaler Commonly known as:  PROVENTIL HFA;VENTOLIN HFA   doxycycline 100 MG capsule Commonly known as:  VIBRAMYCIN   predniSONE 10 MG tablet Commonly known as:  DELTASONE     TAKE these medications     Indication  ferrous sulfate 325 (65 FE) MG tablet Take 1 tablet (325 mg total) by mouth 2 (two) times daily with a meal.  Indication:  Anemia From Inadequate Iron in the Body   multivitamin-prenatal 27-0.8 MG Tabs tablet Take 1 tablet by mouth daily at 12 noon.  Indication:  Pregnancy   nitrofurantoin (macrocrystal-monohydrate) 100 MG capsule Commonly known as:  MACROBID Take 1 capsule (100 mg total) by mouth at bedtime.  Indication:  Prophylaxis   QUEtiapine 50 MG tablet Commonly known as:  SEROQUEL Take 1 tablet (50 mg total) by mouth at bedtime.  Indication:  Major Depressive Disorder      Follow-up Information    [facility], [facility]. Go on 07/12/2017.   Why:  Please go to [facility] on Monday, 07/12/17 at 8AM for your hospital follow up. Thank you.  Contact information: [address] [place] [address] [phone]        [facility].  Schedule an appointment as soon as possible for a visit in 1 week(s).   Contact information: [address] [place] [address] [phone]       [facility]. Schedule an appointment as soon as possible for a visit in 1  week(s).   Contact information: [address]  [address] [place] [address] [phone]          Follow-up recommendations: Follow up with [facility]  Signed: Haskell Riling, MD 07/09/2017, 12:28 PM

## 2017-07-09 NOTE — Discharge Instructions (Signed)
Please follow up with Jacqueline Diaz on Monday 4/1 for mental health evaluation

## 2017-07-09 NOTE — [facility] Group Notes (Signed)
LCSW Group Therapy Note  07/09/2017 1:00 pm  Type of Therapy and Topic:  Group Therapy:  Feelings around Relapse and Recovery  Participation Level:  Did Not Attend   Description of Group:    Patients in this group will discuss emotions they experience before and after a relapse. They will process how experiencing these feelings, or avoidance of experiencing them, relates to having a relapse. Facilitator will guide patients to explore emotions they have related to recovery. Patients will be encouraged to process which emotions are more powerful. They will be guided to discuss the emotional reaction significant others in their lives may have to their relapse or recovery. Patients will be assisted in exploring ways to respond to the emotions of others without this contributing to a relapse.  Therapeutic Goals: 1. Patient will identify two or more emotions that lead to a relapse for them 2. Patient will identify two emotions that result when they relapse 3. Patient will identify two emotions related to recovery 4. Patient will demonstrate ability to communicate their needs through discussion and/or role plays   Summary of Patient Progress:  Noralee CharsLatosha was invited to today's group, but chose not to attend.   Therapeutic Modalities:   Cognitive Behavioral Therapy Solution-Focused Therapy Assertiveness Training Relapse Prevention Therapy   Alease FrameSonya S Koji Niehoff, LCSW 07/09/2017 3:12 PM

## 2017-07-09 NOTE — Progress Notes (Signed)
Received Jacqueline Diaz this AM during breakfast, she received her medications that was prescribed and directed. She denied all psychiatric symptoms including feeling suicidal. She is angry and irritable related to being a patient here. She received her discharge order,the AVS was reviewed and her questions answered.Her personal belongings were returned.She was escorted to the taxi without incident.

## 2017-07-09 NOTE — Progress Notes (Signed)
  [facility] Discharge Plan :  Will you be returning to the same living situation after discharge:  Yes,  returning home. At discharge, do you have transportation home?: Yes,  pt's boyfriend, Melanie CrazierKeShawn. Do you have the ability to pay for your medications: Yes,  insurance.  Release of information consent forms completed and in the chart;  Patient's signature needed at discharge.  Patient to Follow up at: Follow-up Information    [facility]. Go on 07/12/2017.   Why:  [facility] on Monday, 07/12/17 at 8AM for your hospital follow up. Thank you.  Contact information: [address] [place] [id number] [phone]           Next level of care provider has access to [facility] Link:no  Safety Planning and Suicide Prevention discussed: Yes,  with pt and her mother.  Have you used any form of tobacco in the last 30 days? (Cigarettes, Smokeless Tobacco, Cigars, and/or Pipes): No  Has patient been referred to the Quitline?: N/A patient is not a smoker  Patient has been referred for addiction treatment: N/A  Heidi DachKelsey Justiss Gerbino, LCSW 07/09/2017, 9:56 AM

## 2017-07-09 NOTE — Progress Notes (Signed)
CSW met with pt to assist her in finding a ride home. Pt contacted her mother, daughter, and boyfriend--none of which were able to come get her. MD requested pt take a taxi due to being [redacted] weeks pregnant and not feeling comfortable with her taking a bus. CSW will continue to support pt as needed.   Alden Hipp, MSW, LCSW 07/09/2017 3:35 PM

## 2017-07-09 NOTE — Progress Notes (Signed)
New admit to the unit, self talk with mood depressed  Disorder, irritable,miserable and unhappy to be here, patient is [redacted] weeks pregnant, came to the unit with voluntary paper, nurse was therapeutic with patient to avoid self injurious behavior.. Patient expressed depression, bipolar and schizophrenia as her past and present problems needing help, denies any, suicidal thoughts or intents, assessment is complete, skin check by [employer] RN. No issues wit skin, and no contraband found from body search. Patient comply with her medicines. Patient tour the unit and her room, educate patient on safety and nutrition and 15 minute rounding's, patient verbalize understanding of information received . Patient went to bed after, only requires routine visual checks no distress noted.

## 2017-07-09 NOTE — [facility] Suicide Risk Assessment (Signed)
[facility] INPATIENT:  Family/Significant Other Suicide Prevention Education  Suicide Prevention Education:  Education Completed; Fredrik Riggerwanda Crothers, pt's mother, at [phone]  has been identified by the patient as the family member/significant other with whom the patient will be residing, and identified as the person(s) who will aid the patient in the event of a mental health crisis (suicidal ideations/suicide attempt).  With written consent from the patient, the family member/significant other has been provided the following suicide prevention education, prior to the and/or following the discharge of the patient.  The suicide prevention education provided includes the following:  Suicide risk factors  Suicide prevention and interventions  National Suicide Hotline telephone number  [facility] assessment telephone number  [facility] Emergency Assistance 911  [facility] and/or [facility]idential Mobile Crisis [facility] telephone number  Request made of family/significant other to:  Remove weapons (e.g., guns, rifles, knives), all items previously/currently identified as safety concern.    Remove drugs/medications (over-the-counter, prescriptions, illicit drugs), all items previously/currently identified as a safety concern.  The family member/significant other verbalizes understanding of the suicide prevention education information provided.  The family member/significant other agrees to remove the items of safety concern listed above.  Pt's mother reported that she did not have any concerns regarding pt's admission or her being discharged today, 07/09/17. CSW will continue to coordinate with pt's mother as needed for further updates/discharge planning.   Heidi DachKelsey Davita Sublett, LCSW 07/09/2017, 9:38 AM
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
## 2017-07-09 NOTE — Tx Team (Signed)
Interdisciplinary Treatment and Diagnostic Plan Update  07/09/2017 Time of Session: 1100 Jacqueline Diaz MRN: [id number]  Principal Diagnosis: <principal problem not specified>  Secondary Diagnoses: Active Problems:   MDD (major depressive disorder), recurrent, severe, with psychosis (HCC)   Current Medications:  Current Facility-Administered Medications  Medication Dose Route Frequency Provider Last Rate Last Dose  . ferrous sulfate tablet 325 mg  325 mg Oral BID WC McNew, Ileene Hutchinson, MD   325 mg at 07/09/17 0907  . nitrofurantoin (macrocrystal-monohydrate) (MACROBID) capsule 100 mg  100 mg Oral QHS McNew, Holly R, MD      . prenatal multivitamin tablet 1 tablet  1 tablet Oral Q1200 Haskell Riling, MD   1 tablet at 07/09/17 [phone]  . QUEtiapine (SEROQUEL) tablet 50 mg  50 mg Oral QHS McNew, Ileene Hutchinson, MD   50 mg at 07/08/17 2222   PTA Medications: Medications Prior to Admission  Medication Sig Dispense Refill Last Dose  . albuterol (PROVENTIL HFA;VENTOLIN HFA) 108 (90 Base) MCG/ACT inhaler Inhale 2 puffs into the lungs every 6 (six) hours as needed for wheezing or shortness of breath. (Patient not taking: Reported on 07/08/2017) 1 Inhaler 2 Not Taking at Unknown time  . doxycycline (VIBRAMYCIN) 100 MG capsule Take 1 capsule (100 mg total) by mouth 2 (two) times daily. (Patient not taking: Reported on 07/08/2017) 14 capsule 0 Not Taking at Unknown time  . predniSONE (DELTASONE) 10 MG tablet Take 6 tablets (60 mg total) by mouth daily. (Patient not taking: Reported on 07/08/2017) 18 tablet 0 Not Taking at Unknown time  . Prenatal Vit-Fe Fumarate-FA (MULTIVITAMIN-PRENATAL) 27-0.8 MG TABS tablet Take 1 tablet by mouth daily at 12 noon.   Past Week at Unknown time    Patient Stressors: Financial difficulties Health problems Marital or family conflict Substance abuse  Patient Strengths: Capable of independent living Barrister's clerk for treatment/growth Religious  Affiliation  Treatment Modalities: Medication Management, Group therapy, Case management,  1 to 1 session with clinician, Psychoeducation, Recreational therapy.   Physician Treatment Plan for Primary Diagnosis: <principal problem not specified> Long Term Goal(s):     Short Term Goals:    Medication Management: Evaluate patient's response, side effects, and tolerance of medication regimen.  Therapeutic Interventions: 1 to 1 sessions, Unit Group sessions and Medication administration.  Evaluation of Outcomes: Adequate for Discharge  Physician Treatment Plan for Secondary Diagnosis: Active Problems:   MDD (major depressive disorder), recurrent, severe, with psychosis (HCC)  Long Term Goal(s):     Short Term Goals:       Medication Management: Evaluate patient's response, side effects, and tolerance of medication regimen.  Therapeutic Interventions: 1 to 1 sessions, Unit Group sessions and Medication administration.  Evaluation of Outcomes: Adequate for Discharge   RN Treatment Plan for Primary Diagnosis: <principal problem not specified> Long Term Goal(s): Knowledge of disease and therapeutic regimen to maintain health will improve  Short Term Goals: Ability to verbalize feelings will improve, Ability to identify and develop effective coping behaviors will improve and Compliance with prescribed medications will improve  Medication Management: RN will administer medications as ordered by provider, will assess and evaluate patient's response and provide education to patient for prescribed medication. RN will report any adverse and/or side effects to prescribing provider.  Therapeutic Interventions: 1 on 1 counseling sessions, Psychoeducation, Medication administration, Evaluate responses to treatment, Monitor vital signs and CBGs as ordered, Perform/monitor CIWA, COWS, AIMS and Fall Risk screenings as ordered, Perform wound care treatments as ordered.  Evaluation of  Outcomes:  Adequate for Discharge   LCSW Treatment Plan for Primary Diagnosis: <principal problem not specified> Long Term Goal(s): Safe transition to appropriate next level of care at discharge, Engage patient in therapeutic group addressing interpersonal concerns.  Short Term Goals: Engage patient in aftercare planning with referrals and resources, Increase emotional regulation and Increase skills for wellness and recovery  Therapeutic Interventions: Assess for all discharge needs, 1 to 1 time with Social worker, Explore available resources and support systems, Assess for adequacy in community support network, Educate family and significant other(s) on suicide prevention, Complete Psychosocial Assessment, Interpersonal group therapy.  Evaluation of Outcomes: Adequate for Discharge   Progress in Treatment: Attending groups: Yes. Participating in groups: Yes. Taking medication as prescribed: Yes. Toleration medication: Yes. Family/Significant other contact made: Yes, individual(s) contacted:  pt's mother Patient understands diagnosis: Yes. Discussing patient identified problems/goals with staff: Yes. Medical problems stabilized or resolved: Yes. Denies suicidal/homicidal ideation: Yes. Issues/concerns per patient self-inventory: No. Other: None at this time.   New problem(s) identified: No, Describe:  None at this time.  New Short Term/Long Term Goal(s): Pt reported her goal upon discharge from the hospital is, "to get on the right track. I want to get my kids back."   Discharge Plan or Barriers: Pt will discharge home and will [facility] while in the community.   Reason for Continuation of Hospitalization: Depression Medication stabilization  Estimated Length of Stay: 1 day  Attendees: Patient:  Jacqueline Diaz  07/09/2017 11:18 AM  Physician: Dr. Johnella MoloneyMcNew, MD 07/09/2017 11:18 AM  Nursing: Ace Gins'Yawn Chisem, RN 07/09/2017 11:18 AM  RN Care Manager: 07/09/2017  11:18 AM  Social Worker: Heidi DachKelsey Komal Stangelo, LCSW 07/09/2017 11:18 AM  Recreational Therapist:  07/09/2017 11:18 AM  Other: Matilde BashSonya Cater, LCSW 07/09/2017 11:18 AM  Other:  07/09/2017 11:18 AM  Other: 07/09/2017 11:18 AM    Scribe for Treatment Team: Heidi DachKelsey Callan Yontz, LCSW 07/09/2017 11:21 AM
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
## 2017-12-15 ENCOUNTER — Encounter (HOSPITAL_COMMUNITY): Payer: Self-pay

## 2019-05-20 ENCOUNTER — Other Ambulatory Visit: Payer: Self-pay

## 2019-05-20 ENCOUNTER — Emergency Department
Admission: EM | Admit: 2019-05-20 | Discharge: 2019-05-20 | Disposition: A | Discharge status: Home / Self Care | Payer: Medicaid Other | Attending: Student | Admitting: Student

## 2019-05-20 ENCOUNTER — Encounter: Payer: Self-pay | Admitting: Intensive Care

## 2019-05-20 ENCOUNTER — Emergency Department: Payer: Medicaid Other

## 2019-05-20 DIAGNOSIS — Z79899 Other long term (current) drug therapy: Secondary | ICD-10-CM | POA: Insufficient documentation

## 2019-05-20 DIAGNOSIS — Y929 Unspecified place or not applicable: Secondary | ICD-10-CM | POA: Insufficient documentation

## 2019-05-20 DIAGNOSIS — Y999 Unspecified external cause status: Secondary | ICD-10-CM | POA: Insufficient documentation

## 2019-05-20 DIAGNOSIS — S0081XA Abrasion of other part of head, initial encounter: Secondary | ICD-10-CM | POA: Insufficient documentation

## 2019-05-20 DIAGNOSIS — F17210 Nicotine dependence, cigarettes, uncomplicated: Secondary | ICD-10-CM | POA: Insufficient documentation

## 2019-05-20 DIAGNOSIS — S0993XA Unspecified injury of face, initial encounter: Secondary | ICD-10-CM

## 2019-05-20 DIAGNOSIS — W208XXA Other cause of strike by thrown, projected or falling object, initial encounter: Secondary | ICD-10-CM | POA: Diagnosis not present

## 2019-05-20 DIAGNOSIS — Y9389 Activity, other specified: Secondary | ICD-10-CM | POA: Insufficient documentation

## 2019-05-20 DIAGNOSIS — S0990XA Unspecified injury of head, initial encounter: Secondary | ICD-10-CM | POA: Diagnosis present

## 2019-05-20 LAB — URINALYSIS, COMPLETE (UACMP) WITH MICROSCOPIC
Bacteria, UA: NONE SEEN
Bilirubin Urine: NEGATIVE
Glucose, UA: NEGATIVE mg/dL
Hgb urine dipstick: NEGATIVE
Ketones, ur: NEGATIVE mg/dL
Nitrite: NEGATIVE
Protein, ur: NEGATIVE mg/dL
Specific Gravity, Urine: 1.026 (ref 1.005–1.030)
pH: 6 (ref 5.0–8.0)

## 2019-05-20 LAB — CBC
HCT: 32.5 % — ABNORMAL LOW (ref 36.0–46.0)
Hemoglobin: 11.6 g/dL — ABNORMAL LOW (ref 12.0–15.0)
MCH: 32 pg (ref 26.0–34.0)
MCHC: 35.7 g/dL (ref 30.0–36.0)
MCV: 89.5 fL (ref 80.0–100.0)
Platelets: 297 10*3/uL (ref 150–400)
RBC: 3.63 MIL/uL — ABNORMAL LOW (ref 3.87–5.11)
RDW: 11.6 % (ref 11.5–15.5)
WBC: 8.2 10*3/uL (ref 4.0–10.5)
nRBC: 0 % (ref 0.0–0.2)

## 2019-05-20 LAB — BASIC METABOLIC PANEL
Anion gap: 5 (ref 5–15)
BUN: 11 mg/dL (ref 6–20)
CO2: 30 mmol/L (ref 22–32)
Calcium: 8.8 mg/dL — ABNORMAL LOW (ref 8.9–10.3)
Chloride: 106 mmol/L (ref 98–111)
Creatinine, Ser: 0.63 mg/dL (ref 0.44–1.00)
GFR calc Af Amer: >60 mL/min (ref >60)
GFR calc non Af Amer: >60 mL/min (ref >60)
Glucose, Bld: 85 mg/dL (ref 70–99)
Potassium: 3.3 mmol/L — ABNORMAL LOW (ref 3.5–5.1)
Sodium: 141 mmol/L (ref 135–145)

## 2019-05-20 MED ORDER — OXYCODONE HCL 5 MG PO TABS
5.0000 mg | ORAL_TABLET | Freq: Once | ORAL | Status: AC
  Administered 2019-05-20: 5 mg via ORAL
  Filled 2019-05-20: qty 1

## 2019-05-20 MED ORDER — IBUPROFEN 400 MG PO TABS
400.0000 mg | ORAL_TABLET | Freq: Once | ORAL | Status: AC | PRN
  Administered 2019-05-20: 20:00:00 400 mg via ORAL
  Filled 2019-05-20: qty 1

## 2019-05-20 MED ORDER — BACITRACIN ZINC 500 UNIT/GM EX OINT: 1.0000 "application " | TOPICAL_OINTMENT | Freq: Two times a day (BID) | CUTANEOUS | 0 refills | Status: AC

## 2019-05-20 NOTE — ED Triage Notes (Addendum)
Patient presents with laceration right below eyebrow. No bloody drainage. Eye is also bruised/swollen. Near end of triage patient reports she has been feeling lightheaded and dizzy for "awhile now." Reports she has HX of low iron and was suppose to have it rechecked and never did. Also reports she needs to take a pregnancy test.

## 2019-05-20 NOTE — Discharge Instructions (Addendum)
Thank you for letting us take care of you in the ER today.   Your CT scans were negative for any facial fractures.   Please apply an antibiotic ointment on your abrasions twice a day until well healed. We have prescribed you an ointment for this, or you can obtain one over the coutner. Gently clean the area with soap and water to help prevent infection twice a day before applying.   Follow-up with your primary care doctor to review your ER visit and follow-up on your healing.  Information for 2 clinics is provided below.  Additionally, please follow-up with an eye doctor for recheck to ensure that you are healing well and that your vision continues to improve.  Information for an eye doctor as below as well.  Please return to the emergency department for any new or worsening symptoms.
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
## 2019-05-20 NOTE — ED Notes (Signed)
Instruction reviewed. Wound site cleaned and showed pt how to care for site. VSS.

## 2019-05-22 LAB — POCT PREGNANCY, URINE: Preg Test, Ur: NEGATIVE

## 2019-09-04 ENCOUNTER — Other Ambulatory Visit: Payer: Self-pay

## 2019-09-04 ENCOUNTER — Encounter: Payer: Self-pay | Admitting: Physician Assistant

## 2019-09-04 ENCOUNTER — Ambulatory Visit (LOCAL_COMMUNITY_HEALTH_CENTER): Payer: Medicaid Other | Admitting: Physician Assistant

## 2019-09-04 ENCOUNTER — Ambulatory Visit: Payer: Medicaid Other

## 2019-09-04 VITALS — BP 140/94 | Ht 64.0 in | Wt 133.0 lb

## 2019-09-04 DIAGNOSIS — A539 Syphilis, unspecified: Secondary | ICD-10-CM

## 2019-09-04 DIAGNOSIS — Z3009 Encounter for other general counseling and advice on contraception: Secondary | ICD-10-CM

## 2019-09-04 DIAGNOSIS — Z01419 Encounter for gynecological examination (general) (routine) without abnormal findings: Secondary | ICD-10-CM

## 2019-09-04 DIAGNOSIS — N760 Acute vaginitis: Secondary | ICD-10-CM

## 2019-09-04 DIAGNOSIS — Z30013 Encounter for initial prescription of injectable contraceptive: Secondary | ICD-10-CM

## 2019-09-04 DIAGNOSIS — Z113 Encounter for screening for infections with a predominantly sexual mode of transmission: Secondary | ICD-10-CM

## 2019-09-04 LAB — WET PREP FOR TRICH, YEAST, CLUE
Trichomonas Exam: NEGATIVE
Yeast Exam: NEGATIVE

## 2019-09-04 MED ORDER — MEDROXYPROGESTERONE ACETATE 150 MG/ML IM SUSP
150.0000 mg | Freq: Once | INTRAMUSCULAR | Status: AC
  Administered 2019-09-04: 150 mg via INTRAMUSCULAR

## 2019-09-04 MED ORDER — CLINDAMYCIN PHOSPHATE 2 % VA CREA: 1.0000 | TOPICAL_CREAM | Freq: Every day | VAGINAL | 0 refills | Status: AC

## 2019-09-04 NOTE — Progress Notes (Signed)
[employer] results reviewed. Patient treated for BV per provider orders. Depo given and tolerated well. Tawny Hopping, RN

## 2019-09-04 NOTE — Progress Notes (Signed)
Here today for STD screening and birth control. Last PE here was 05/09/2018, no records of Pap Smears here and last Depo here was 07/25/2018. Is interested in restarting Depo. Accepts all STD screening. Tawny Hopping, RN

## 2019-09-05 DIAGNOSIS — A539 Syphilis, unspecified: Secondary | ICD-10-CM | POA: Insufficient documentation

## 2019-09-05 NOTE — Progress Notes (Signed)
[place] problem visit  [place] Department  Subjective:  Jacqueline Diaz is a 39 y.o. being seen today for vaginal irritation and to restart Depo.  Chief Complaint  Patient presents with  . Contraception    HPI  Patient into clinic requesting to restart Depo to help d/c irregular bleeding.  Also, states that she has had vaginal irritation and itching for 2 days.  Denies other symptoms.  States that she got Depo in 2019 in hospital after delivery and then got 2 more Depo shots here in early 2020.  Reports that she has not had elevated BP since she had it a few times during her last pregnancy.   Does the patient have a current or past history of drug use? No   No components found for: HCV]   Health Maintenance Due  Topic Date Due  . COVID-19 Vaccine (1) Never done  . HIV Screening  Never done  . PAP SMEAR-Modifier  Never done    Review of Systems  All other systems reviewed and are negative.   The following portions of the patient's history were reviewed and updated as appropriate: allergies, current medications, past family history, past medical history, past social history, past surgical history and problem list. Problem list updated.   See flowsheet for other program required questions.  Objective:   Vitals:   09/04/19 1347  BP: (!) 140/94  Weight: 133 lb (60.3 kg)  Height: 5\' 4"  (1.626 m)    Physical Exam Vitals reviewed.  Constitutional:      General: She is not in acute distress.    Appearance: Normal appearance.  HENT:     Head: Normocephalic and atraumatic.  Eyes:     Conjunctiva/sclera: Conjunctivae normal.  Pulmonary:     Effort: Pulmonary effort is normal.  Abdominal:     Palpations: Abdomen is soft. There is no mass.     Tenderness: There is no abdominal tenderness. There is no guarding or rebound.  Genitourinary:    General: Normal vulva.     Rectum: Normal.     Comments: External genitalia/pubic area  without nits, lice, edema, erythema, lesions and inguinal adenopathy. Vagina with normal mucosa and moderate menstrual blood present. Cervix without visible lesions. Uterus firm, mobile, nt, no masses, no CMT, no adnexal tenderness or fullness. Musculoskeletal:     Cervical back: Neck supple. No tenderness.  Lymphadenopathy:     Cervical: No cervical adenopathy.  Skin:    General: Skin is warm and dry.     Findings: No bruising, erythema, lesion or rash.  Neurological:     Mental Status: She is alert and oriented to person, place, and time.  Psychiatric:        Mood and Affect: Mood normal.        Thought Content: Thought content normal.        Judgment: Judgment normal.       Assessment and Plan:  Jacqueline Diaz is a 39 y.o. female presenting to the [facility] Department for a Women's Health problem visit  1. Encounter for counseling regarding contraception Reviewed with patient risks, benefits, and SE of Depo and when to call clinic for irregular bleeding. Rec condoms with all sex for 2 weeks and enc always for STD protection. Rec check OTC pregnancy test in 2 weeks and RTC if positive. - medroxyPROGESTERone (DEPO-PROVERA) injection 150 mg  2. Screening for STD (sexually transmitted disease) Await test results.  Counseled that RN will call  if needs to RTC for further treatment once results are back.  - WET PREP FOR TRICH, YEAST, CLUE - Chlamydia/Gonorrhea [place] [facility] - HIV [place] [facility] - Syphilis Serology, [place] [facility] - Gonococcus culture  3. Visit for gynecologic examination See above under #1 and #2.  4. Initiation of Depo Provera OK for Depo 150mg  IM today. Rec RTC for PE and pap in ~11-13 weeks.  5. BV (bacterial vaginosis) Treat with Clindamycin 2% vaginal cream due to Metronidazole allergy. Enc to use OTC antifungal cream if has itching during or just after antibiotic cream.  - clindamycin (CLEOCIN) 2 % vaginal cream; Place 1 Applicatorful  vaginally at bedtime for 7 days.  Dispense: 40 g; Refill: 0     No follow-ups on file.  No future appointments.  , PA

## 2019-09-09 LAB — GONOCOCCUS CULTURE

## 2019-09-14 ENCOUNTER — Telehealth: Payer: Self-pay | Admitting: Family Medicine

## 2019-09-22 NOTE — Telephone Encounter (Signed)
Patient returned call, patient verified by password, patient informed of + gonorrhea and chlamydia, patient needs treatment, appointment scheduled.  Patient number changed to match number calling from.  Patient was complaining about length of time it took to call her.  Patient was informed that number listed was not a working number and so message was left with mother who was next contact on the list.

## 2019-09-25 ENCOUNTER — Ambulatory Visit: Payer: Medicaid Other

## 2019-09-25 ENCOUNTER — Other Ambulatory Visit: Payer: Self-pay

## 2019-09-25 VITALS — Wt 134.0 lb

## 2019-09-25 DIAGNOSIS — Z708 Other sex counseling: Secondary | ICD-10-CM

## 2019-09-25 DIAGNOSIS — A549 Gonococcal infection, unspecified: Secondary | ICD-10-CM | POA: Diagnosis not present

## 2019-09-25 DIAGNOSIS — A749 Chlamydial infection, unspecified: Secondary | ICD-10-CM | POA: Diagnosis not present

## 2019-09-25 MED ORDER — CEFTRIAXONE SODIUM 500 MG IJ SOLR
500.0000 mg | Freq: Once | INTRAMUSCULAR | Status: AC
  Administered 2019-09-25: 500 mg via INTRAMUSCULAR

## 2019-09-25 MED ORDER — AZITHROMYCIN 500 MG PO TABS
1000.0000 mg | ORAL_TABLET | Freq: Once | ORAL | Status: AC
  Administered 2019-09-25: 1000 mg via ORAL

## 2019-09-25 NOTE — Progress Notes (Signed)
Pt. Here for tx of gonorrhea and chlamydia. STD counseling done. Azithromycin pills crushed and placed in pt's applesauce per pt request as she reports difficulty in swallowing pills. Pt. Tolerated azithromycin po and ceftriaxone IM without difficulty. Report cards declined by pt as she reports partner is aware of his need for tx. Pt. Refused to stay for 15 min observation after injection. Jerel Shepherd, RN

## 2019-11-06 ENCOUNTER — Ambulatory Visit: Payer: Medicaid Other

## 2019-11-07 ENCOUNTER — Encounter: Payer: Self-pay | Admitting: Family Medicine

## 2019-11-07 ENCOUNTER — Ambulatory Visit: Payer: Medicaid Other | Admitting: Family Medicine

## 2019-11-07 ENCOUNTER — Ambulatory Visit: Payer: Medicaid Other

## 2019-11-07 ENCOUNTER — Other Ambulatory Visit: Payer: Self-pay

## 2019-11-07 VITALS — BP 125/81 | Ht 65.0 in | Wt 138.0 lb

## 2019-11-07 DIAGNOSIS — Z113 Encounter for screening for infections with a predominantly sexual mode of transmission: Secondary | ICD-10-CM | POA: Diagnosis not present

## 2019-11-07 DIAGNOSIS — A599 Trichomoniasis, unspecified: Secondary | ICD-10-CM

## 2019-11-07 DIAGNOSIS — N739 Female pelvic inflammatory disease, unspecified: Secondary | ICD-10-CM

## 2019-11-07 LAB — WET PREP FOR TRICH, YEAST, CLUE
Trichomonas Exam: POSITIVE — AB
Yeast Exam: NEGATIVE

## 2019-11-07 MED ORDER — CEFTRIAXONE SODIUM 250 MG IJ SOLR: 250.0000 mg | Freq: Once | INTRAMUSCULAR | Status: DC

## 2019-11-07 MED ORDER — DOXYCYCLINE HYCLATE 100 MG PO TABS: 100.0000 mg | ORAL_TABLET | Freq: Two times a day (BID) | ORAL | 0 refills | Status: DC

## 2019-11-07 MED ORDER — METRONIDAZOLE 0.75 % VA GEL: 1.0000 | Freq: Two times a day (BID) | VAGINAL | 0 refills | Status: AC

## 2019-11-07 MED ORDER — CEFTRIAXONE SODIUM 500 MG IJ SOLR
500.0000 mg | Freq: Once | INTRAMUSCULAR | Status: AC
  Administered 2019-11-07: 500 mg via INTRAMUSCULAR

## 2019-11-07 MED ORDER — DOXYCYCLINE HYCLATE 100 MG PO TABS: 100.0000 mg | ORAL_TABLET | Freq: Two times a day (BID) | ORAL | 0 refills | Status: AC

## 2019-11-07 MED ORDER — METRONIDAZOLE 0.75 % VA GEL: 1.0000 | Freq: Two times a day (BID) | VAGINAL | 0 refills | Status: DC

## 2019-11-07 NOTE — Progress Notes (Signed)
Pt is here for Pap smear and reports she needs STD screening as she is having irritation and vaginal burning and discharge. Pt's last Depo was 09/04/2019 so pt is 9 weeks and 1 day post her last Depo. Pt reports she did not do a home pregnancy test 2 weeks after her last Depo.

## 2019-11-07 NOTE — Progress Notes (Signed)
Pt very agitated upon posting. Temp is 98.6 and Jacqueline Pickett, FNP made aware. UPT is negative today. Wet mount reviewed with Jacqueline Pickett, FNP and pt received Metrogel vaginal per Jacqueline Pickett, FNP verbal and written order as pt refuses to take recommended treatment for Trich even after counseling pt on the importance of it. Pt received treatment for PID per provider and standing order. Pt tolerated Ceftriaxone 500mg  IM well but refuses to stay the recommended 20 minutes even after explaining the need to monitor for any issues or side effects. Pt took the Doxycycline pills with her but states that she doesn't like taking pills and will only take a few, explained to pt of the importance of taking medication as prescribed to get rid of the PID, but pt still persistent and states that she's not taking all of the pills. Explained to pt that she needs to be scheduled to RTC for PID follow up in 2-3 days and offered to schedule this appt for pt but pt declines to be scheduled for appt today. Pt states that she is scheduled to return for a physical and Depo on 11/20/2019, but counseled pt that there is not an appt scheduled for pt for that day and offered to schedule physical and Depo appt, but pt declines and states that it's already scheduled and pt left. 01/20/2020, FNP made aware.

## 2019-11-07 NOTE — Progress Notes (Addendum)
[facility]  [facility]  Subjective:  Jacqueline Diaz is a 39 y.o. being seen today for   Chief Complaint  Patient presents with  . Contraception    Pap  . SEXUALLY TRANSMITTED DISEASE    STD screening    HPI  Client states that she was treated for + GC and chlamydia 09/25/2019@ [facility].  She states that her BF was not treated and they've been sexually active without using condoms about 5 days after her treatment.  She is now c/o lower abdomen pain that radiates to her back, genital itching, vaginal irritation, mucous discharge with odor,  and burning with urination on external genitalia x 2-3 days.  Also, client states that she has been having mild pain with sex for a couple of weeks.  She denies fever,  She does have episodes of nausea.   Does the patient have a current or past history of drug use? Yes   No components found for: HCV]   Health Maintenance Due  Topic Date Due  . Hepatitis C Screening  Never done  . COVID-19 Vaccine (1) Never done  . HIV Screening  Never done  . PAP SMEAR-Modifier  Never done    Review of Systems  Gastrointestinal: Positive for abdominal pain and nausea.  Genitourinary: Positive for dysuria.       Vaginal discharge with odor x 2-3 days, mild pain with sex x couple weeks.  All other systems reviewed and are negative.   The following portions of the patient's history were reviewed and updated as appropriate: allergies, current medications, past family history, past medical history, past social history, past surgical history and problem list. Problem list updated.   See flowsheet for other program required questions.  Objective:   Vitals:   11/07/19 1304  BP: 125/81  Weight: 138 lb (62.6 kg)  Height: 5\' 5"  (1.651 m)    Physical Exam Vitals and nursing note reviewed.  Constitutional:      Appearance: Normal appearance.  HENT:     Head: Normocephalic.  Pulmonary:     Effort:  Pulmonary effort is normal.  Abdominal:     Palpations: Abdomen is soft.     Tenderness: There is abdominal tenderness.     Comments: Abd- RLQ tenderness, no rebound/gaurding/mass/CVA tenderness  Genitourinary:    General: Normal vulva.     Exam position: Lithotomy position.     Pubic Area: No rash or pubic lice.      Labia:        Right: No rash, tenderness, lesion or injury.        Left: No rash, tenderness, lesion or injury.      Vagina: Vaginal discharge present. No tenderness.     Cervix: Discharge and erythema present. No cervical motion tenderness.     Uterus: Tender. Not enlarged.      Adnexa:        Right: No tenderness or fullness.       Rectum: Normal.     Comments: L ovary- absent Musculoskeletal:        General: Normal range of motion.  Lymphadenopathy:     Cervical: No cervical adenopathy.     Lower Body: No right inguinal adenopathy. No left inguinal adenopathy.  Skin:    General: Skin is warm and dry.  Neurological:     Mental Status: She is alert and oriented to person, place, and time.       Assessment and Plan:  Jacqueline Diaz is a 39 y.o. female presenting to the [facility] Department for a Women's Health problem visit  1. Screening examination for venereal disease  - WET PREP FOR TRICH, YEAST, CLUE - Gonococcus culture - Chlamydia/Gonorrhea [place] [facility] - HIV/HCV [place] [facility] - Hepatitis Serology, [place] [facility] - Syphilis Serology, [place] [facility] - Gonococcus culture   2. Pelvic inflammatory disease - cefTRIAXone (ROCEPHIN) injection 500 mg ( this dose given d/t her known + GC culture/reinfection) - doxycycline (VIBRA-TABS) 100 MG tablet; Take 1 tablet (100 mg total) by mouth 2 (two) times daily.  Dispense: 28 tablet; Refill: 0 Please check temp Co. Client for PID f/u 2-3 days Partner needs to be treated for PID and he wasn't treated for GC/chlamydia 09/2019. Co. No sexual activity until completes medication.  3.  Trichomoniasis Client refused to take the oral Metronidazole for treatment.  States that medication upsets her stomach, she denies allergic rxn to medication. Offered Metrogel vaginal to client, she agreed to use for a couple of days.  Client became more agitated during her visit.  She stated that she probably wasn't going to take any of her pills. Attempted to explain treatment necessary for PID with known  GC/chlamydia exposure of untreated partner.  Client stated again that she probably wouldn't take her pills.   No follow-ups on file.  No future appointments.  Jacqueline Pickett, FNP

## 2019-11-10 ENCOUNTER — Encounter: Payer: Self-pay | Admitting: Family Medicine

## 2019-11-10 LAB — PREGNANCY, URINE: Preg Test, Ur: NEGATIVE

## 2019-11-10 LAB — HEPATITIS B SURFACE ANTIGEN: Hepatitis B Surface Ag: NONREACTIVE

## 2019-11-10 NOTE — Addendum Note (Signed)
Addended by: Tawny Hopping A on: 11/10/2019 08:50 AM   Modules accepted: Orders

## 2019-11-12 LAB — GONOCOCCUS CULTURE

## 2019-11-13 LAB — HM HEPATITIS C SCREENING LAB: HM Hepatitis Screen: NEGATIVE

## 2019-11-13 LAB — HM HIV SCREENING LAB: HM HIV Screening: NEGATIVE

## 2019-11-15 ENCOUNTER — Encounter: Payer: Self-pay | Admitting: Family Medicine

## 2019-11-22 ENCOUNTER — Encounter: Payer: Self-pay | Admitting: Emergency Medicine

## 2019-11-22 DIAGNOSIS — R112 Nausea with vomiting, unspecified: Secondary | ICD-10-CM | POA: Insufficient documentation

## 2019-11-22 DIAGNOSIS — R109 Unspecified abdominal pain: Secondary | ICD-10-CM | POA: Diagnosis present

## 2019-11-22 DIAGNOSIS — Z5321 Procedure and treatment not carried out due to patient leaving prior to being seen by health care provider: Secondary | ICD-10-CM | POA: Diagnosis not present

## 2019-11-22 DIAGNOSIS — R197 Diarrhea, unspecified: Secondary | ICD-10-CM | POA: Diagnosis not present

## 2019-11-22 LAB — POCT PREGNANCY, URINE: Preg Test, Ur: NEGATIVE

## 2019-11-22 NOTE — ED Triage Notes (Signed)
Pt c/o "all over" abdominal pain x2 days with N/V/D.

## 2019-11-23 ENCOUNTER — Emergency Department
Admission: EM | Admit: 2019-11-23 | Discharge: 2019-11-23 | Disposition: A | Discharge status: Home / Self Care | Payer: Medicaid Other | Attending: Emergency Medicine | Admitting: Emergency Medicine

## 2019-11-23 LAB — CBC WITH DIFFERENTIAL/PLATELET
Abs Immature Granulocytes: 0.08 10*3/uL — ABNORMAL HIGH (ref 0.00–0.07)
Basophils Absolute: 0 10*3/uL (ref 0.0–0.1)
Basophils Relative: 0 %
Eosinophils Absolute: 0.1 10*3/uL (ref 0.0–0.5)
Eosinophils Relative: 1 %
HCT: 35.2 % — ABNORMAL LOW (ref 36.0–46.0)
Hemoglobin: 12.6 g/dL (ref 12.0–15.0)
Immature Granulocytes: 1 %
Lymphocytes Relative: 13 %
Lymphs Abs: 2.1 10*3/uL (ref 0.7–4.0)
MCH: 33.5 pg (ref 26.0–34.0)
MCHC: 35.8 g/dL (ref 30.0–36.0)
MCV: 93.6 fL (ref 80.0–100.0)
Monocytes Absolute: 1 10*3/uL (ref 0.1–1.0)
Monocytes Relative: 6 %
Neutro Abs: 12.7 10*3/uL — ABNORMAL HIGH (ref 1.7–7.7)
Neutrophils Relative %: 79 %
Platelets: 283 10*3/uL (ref 150–400)
RBC: 3.76 MIL/uL — ABNORMAL LOW (ref 3.87–5.11)
RDW: 12 % (ref 11.5–15.5)
WBC: 16 10*3/uL — ABNORMAL HIGH (ref 4.0–10.5)
nRBC: 0 % (ref 0.0–0.2)

## 2019-11-23 LAB — URINALYSIS, COMPLETE (UACMP) WITH MICROSCOPIC
Bacteria, UA: NONE SEEN
Glucose, UA: NEGATIVE mg/dL
Ketones, ur: NEGATIVE mg/dL
Nitrite: NEGATIVE
Protein, ur: 100 mg/dL — AB
Specific Gravity, Urine: 1.033 — ABNORMAL HIGH (ref 1.005–1.030)
pH: 5 (ref 5.0–8.0)

## 2019-11-23 LAB — LIPASE, BLOOD: Lipase: 21 U/L (ref 11–51)

## 2019-11-23 LAB — COMPREHENSIVE METABOLIC PANEL
ALT: 18 U/L (ref 0–44)
AST: 13 U/L — ABNORMAL LOW (ref 15–41)
Albumin: 4.5 g/dL (ref 3.5–5.0)
Alkaline Phosphatase: 67 U/L (ref 38–126)
Anion gap: 13 (ref 5–15)
BUN: 12 mg/dL (ref 6–20)
CO2: 27 mmol/L (ref 22–32)
Calcium: 9.5 mg/dL (ref 8.9–10.3)
Chloride: 98 mmol/L (ref 98–111)
Creatinine, Ser: 0.59 mg/dL (ref 0.44–1.00)
GFR calc Af Amer: >60 mL/min (ref >60)
GFR calc non Af Amer: >60 mL/min (ref >60)
Glucose, Bld: 99 mg/dL (ref 70–99)
Potassium: 3 mmol/L — ABNORMAL LOW (ref 3.5–5.1)
Sodium: 138 mmol/L (ref 135–145)
Total Bilirubin: 1.3 mg/dL — ABNORMAL HIGH (ref 0.3–1.2)
Total Protein: 8.3 g/dL — ABNORMAL HIGH (ref 6.5–8.1)

## 2019-11-23 LAB — TROPONIN I (HIGH SENSITIVITY): Troponin I (High Sensitivity): 14 ng/L (ref <18)

## 2019-11-27 ENCOUNTER — Telehealth: Payer: Self-pay | Admitting: Emergency Medicine

## 2019-11-27 NOTE — Telephone Encounter (Signed)
Called patient due to lwot to inquire about condition and follow up plans.  No answer and no voicemail  

## 2019-12-13 ENCOUNTER — Other Ambulatory Visit: Payer: Self-pay

## 2019-12-13 ENCOUNTER — Ambulatory Visit (LOCAL_COMMUNITY_HEALTH_CENTER): Payer: Medicaid Other | Admitting: Advanced Practice Midwife

## 2019-12-13 VITALS — BP 128/89 | Ht 64.0 in | Wt 129.4 lb

## 2019-12-13 DIAGNOSIS — F209 Schizophrenia, unspecified: Secondary | ICD-10-CM | POA: Insufficient documentation

## 2019-12-13 DIAGNOSIS — Z30013 Encounter for initial prescription of injectable contraceptive: Secondary | ICD-10-CM | POA: Diagnosis not present

## 2019-12-13 DIAGNOSIS — F1410 Cocaine abuse, uncomplicated: Secondary | ICD-10-CM

## 2019-12-13 DIAGNOSIS — F319 Bipolar disorder, unspecified: Secondary | ICD-10-CM

## 2019-12-13 DIAGNOSIS — N760 Acute vaginitis: Secondary | ICD-10-CM

## 2019-12-13 DIAGNOSIS — Z3009 Encounter for other general counseling and advice on contraception: Secondary | ICD-10-CM

## 2019-12-13 DIAGNOSIS — Z202 Contact with and (suspected) exposure to infections with a predominantly sexual mode of transmission: Secondary | ICD-10-CM

## 2019-12-13 DIAGNOSIS — Z8619 Personal history of other infectious and parasitic diseases: Secondary | ICD-10-CM | POA: Insufficient documentation

## 2019-12-13 DIAGNOSIS — F1210 Cannabis abuse, uncomplicated: Secondary | ICD-10-CM

## 2019-12-13 DIAGNOSIS — F17200 Nicotine dependence, unspecified, uncomplicated: Secondary | ICD-10-CM

## 2019-12-13 LAB — WET PREP FOR TRICH, YEAST, CLUE
Trichomonas Exam: NEGATIVE
Yeast Exam: NEGATIVE

## 2019-12-13 LAB — PREGNANCY, URINE: Preg Test, Ur: NEGATIVE

## 2019-12-13 MED ORDER — MEDROXYPROGESTERONE ACETATE 150 MG/ML IM SUSP
150.0000 mg | Freq: Once | INTRAMUSCULAR | Status: AC
  Administered 2019-12-13: 150 mg via INTRAMUSCULAR

## 2019-12-13 MED ORDER — METRONIDAZOLE 500 MG PO TABS: 500.0000 mg | ORAL_TABLET | Freq: Two times a day (BID) | ORAL | 0 refills | Status: AC

## 2019-12-13 MED ORDER — CEFTRIAXONE SODIUM 500 MG IJ SOLR
500.0000 mg | Freq: Once | INTRAMUSCULAR | Status: AC
  Administered 2019-12-13: 500 mg via INTRAMUSCULAR

## 2019-12-13 NOTE — Addendum Note (Signed)
Addended by: Burt Knack on: 12/13/2019 03:02 PM   Modules accepted: Orders

## 2019-12-13 NOTE — Addendum Note (Signed)
Addended by: Burt Knack on: 12/13/2019 02:59 PM   Modules accepted: Orders

## 2019-12-13 NOTE — Progress Notes (Addendum)
No pap records in [facility]. Wet mount reviewed, patient treated for BV per SO. PT negative. Depo given, right glute, tolerated well, given next Depo due card. Per provider patient to receive 500mg  ceftriaxone due to partner who states he has gonorrhea. Per patient, partner hasn't yet been tested, and will make appointment today. Per provider patient given 500mg  ceftriaxone left glute and counseled to stay for observation for 20 minutes, patient declined to stay. Patient counseled to go to ED if she feels SOB, facial swelling, rash. , RN

## 2019-12-13 NOTE — Progress Notes (Signed)
Jacqueline Diaz: [phone]    Family Planning Visit- Initial Visit  Subjective:  Jacqueline Diaz is a 39 y.o. SBF [id number]  being seen today for an initial well woman visit and to discuss family planning options.  She is currently using None for pregnancy prevention. Patient reports she does not know want a pregnancy in the next year.  Patient has the following medical conditions has MDD (major depressive disorder), recurrent, severe, with psychosis (HCC); Syphilis; Smoker; Cocaine abuse (HCC); Marijuana abuse; Schizophrenia (HCC); Bipolar 1 disorder (HCC); and Hx of syphilis on their problem list.  Chief Complaint  Patient presents with  . Contraception  . Exposure to STD    Patient reports last DMPA 09/04/19 and wants DMPA today.  Last sex this am without condom; new partner as of mid August; 5 sex partners in last 3 mo.  LMP 08/2019.  States she wouldn't mind a pregnancy but then asking for DMPA. Refuses ECP.  Living with her sister and sister's boyfriend, pt's 26 yo daughter and 2 yo grandson.  Unemployed.  Smoker.  Last MJ this am.  States this boyfriend has GC (upon further questioning, this man has not been tested recently, but he has had GC in the past and it feels just like it.  She has told him to make an appt for testing).  C/o external vaginal itching and dysuria since last week.  No pap records in [facility] or [facility].   Patient denies vaping, drug use besides MJ  Body mass index is 22.21 kg/m. - Patient is eligible for diabetes screening based on BMI and age >34?  not applicable HA1C ordered? not applicable  Patient reports 5 of partners in last year. Desires STI screening?  Yes  Has patient been screened once for HCV in the past?  No  No results found for: HCVAB  Does the patient have current drug use (including MJ), have a partner with drug use, and/or has been incarcerated since  last result? Yes  If yes-- Screen for HCV through [facility]   Does the patient meet criteria for HBV testing? Yes  Criteria:  -Household, sexual or needle sharing contact with HBV -History of drug use -HIV positive -Those with known Hep C   Health Maintenance Due  Topic Date Due  . COVID-19 Vaccine (1) Never done  . PAP SMEAR-Modifier  Never done  . INFLUENZA VACCINE  11/12/2019    Review of Systems  All other systems reviewed and are negative.   The following portions of the patient's history were reviewed and updated as appropriate: allergies, current medications, past family history, past medical history, past social history, past surgical history and problem list. Problem list updated.   See flowsheet for other program required questions.  Objective:   Vitals:   12/13/19 1109  BP: 128/89  Weight: 129 lb 6.4 oz (58.7 kg)  Height: 5\' 4"  (1.626 m)    Physical Exam Constitutional:      Appearance: Normal appearance. She is normal weight.  HENT:     Head: Normocephalic and atraumatic.     Mouth/Throat:     Mouth: Mucous membranes are moist.  Eyes:     Conjunctiva/sclera: Conjunctivae normal.  Cardiovascular:     Rate and Rhythm: Normal rate and regular rhythm.  Pulmonary:     Effort: Pulmonary effort is normal.     Breath sounds: Normal breath sounds.  Chest:  Breasts:        Right: Normal.        Left: Normal.  Abdominal:     Palpations: Abdomen is soft.     Comments: Soft, poor tone, without tenderness or masses  Genitourinary:    General: Normal vulva.     Exam position: Lithotomy position.     Vagina: Vaginal discharge (grey malodourous leukorrhea, ph>4.5) present.     Cervix: Normal.     Uterus: Normal.      Adnexa: Right adnexa normal and left adnexa normal.     Rectum: Normal.  Musculoskeletal:        General: Normal range of motion.     Cervical back: Normal range of motion and neck supple.  Skin:    General: Skin is warm and dry.   Neurological:     Mental Status: She is alert.  Psychiatric:        Mood and Affect: Mood normal.       Assessment and Plan:  Jacqueline Diaz is a 39 y.o. female presenting to the [facility] Department for an initial well woman exam/family planning visit  Contraception counseling: Reviewed all forms of birth control options in the tiered based approach. available including abstinence; over the counter/barrier methods; hormonal contraceptive medication including pill, patch, ring, injection,contraceptive implant, ECP; hormonal and nonhormonal IUDs; permanent sterilization options including vasectomy and the various tubal sterilization modalities. Risks, benefits, and typical effectiveness rates were reviewed.  Questions were answered.  Written information was also given to the patient to review.  Patient desires DMPA, this was prescribed for patient. She will follow up in 11-13 wks for surveillance.  She was told to call with any further questions, or with any concerns about this method of contraception.  Emphasized use of condoms 100% of the time for STI prevention.  Patient was offered ECP. ECP was not accepted by the patient. ECP counseling was not given - see RN documentation  1. Family planning Treat wet mount per standing orders Immunization nurse consult Treat as contact to GC (states her boyfriend has it) - Pregnancy, urine - WET PREP FOR TRICH, YEAST, CLUE - Chlamydia/Gonorrhea [place] [facility] - HIV/HCV [place] [facility] - Syphilis Serology, [place] [facility] - IGP, [facility] HPV  2. Encounter for initial prescription of injectable contraceptive PT neg today.  Pt refuses ECP and was counseled on high possibility of pregnancy due to unprotected coitus but pt still wants DMPA today May have DMPA 150 mg IM q 11-13 wks x 1 year Please counsel on need for abstinance/back up condoms next 7 days Pt counseled to do PT 12/27/19 and call if + Will need PT before receives next DMPA  3.  Smoker Counseled to stop via 5 A's   4. Cocaine abuse (HCC)   5. Marijuana abuse Last use this am  6. Schizophrenia, unspecified type (HCC)   7. Bipolar 1 disorder (HCC)   8. Hx of syphilis      Return for 11-13 wk DMPA.  No future appointments.  Alberteen Spindle, CNM

## 2019-12-18 LAB — IGP, APTIMA HPV
HPV Aptima: NEGATIVE
PAP Smear Comment: 0

## 2019-12-20 ENCOUNTER — Encounter: Payer: Self-pay | Admitting: Family Medicine

## 2019-12-20 LAB — HM HIV SCREENING LAB: HM HIV Screening: NEGATIVE

## 2019-12-20 LAB — HM HEPATITIS C SCREENING LAB: HM Hepatitis Screen: NEGATIVE

## 2020-03-01 ENCOUNTER — Ambulatory Visit: Payer: Medicaid Other

## 2020-04-25 ENCOUNTER — Ambulatory Visit: Payer: Medicaid Other | Admitting: Physician Assistant

## 2020-04-25 ENCOUNTER — Other Ambulatory Visit: Payer: Self-pay

## 2020-04-25 ENCOUNTER — Ambulatory Visit (LOCAL_COMMUNITY_HEALTH_CENTER): Payer: Medicaid Other

## 2020-04-25 DIAGNOSIS — Z113 Encounter for screening for infections with a predominantly sexual mode of transmission: Secondary | ICD-10-CM

## 2020-04-25 DIAGNOSIS — Z202 Contact with and (suspected) exposure to infections with a predominantly sexual mode of transmission: Secondary | ICD-10-CM | POA: Diagnosis not present

## 2020-04-25 DIAGNOSIS — Z23 Encounter for immunization: Secondary | ICD-10-CM

## 2020-04-25 LAB — WET PREP FOR TRICH, YEAST, CLUE
Trichomonas Exam: NEGATIVE
Yeast Exam: NEGATIVE

## 2020-04-25 MED ORDER — CEFTRIAXONE SODIUM 500 MG IJ SOLR
500.0000 mg | Freq: Once | INTRAMUSCULAR | Status: AC
  Administered 2020-04-25: 500 mg via INTRAMUSCULAR

## 2020-04-25 MED ORDER — AZITHROMYCIN 500 MG PO TABS
1000.0000 mg | ORAL_TABLET | Freq: Once | ORAL | Status: AC
  Administered 2020-04-25: 1000 mg via ORAL

## 2020-04-26 ENCOUNTER — Encounter: Payer: Self-pay | Admitting: Physician Assistant

## 2020-04-26 NOTE — Progress Notes (Signed)
[facility] Department STI clinic/screening visit  Subjective:  Jacqueline Diaz is a 40 y.o. female being seen today for an STI screening visit. The patient reports they do have symptoms.  Patient reports that they do not desire a pregnancy in the next year.   They reported they are not interested in discussing contraception today.  Patient's last menstrual period was 04/13/2020 (approximate).   Patient has the following medical conditions:   Patient Active Problem List   Diagnosis Date Noted  . Smoker 12/13/2019  . Cocaine abuse (HCC) 12/13/2019  . Marijuana abuse 12/13/2019  . Schizophrenia (HCC) 12/13/2019  . Bipolar 1 disorder (HCC) 12/13/2019  . Hx of syphilis 12/13/2019  . Syphilis 09/05/2019  . 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 SEXUALLY TRANSMITTED DISEASE    screening    HPI  Patient reports that she has had an internal and external vaginal irritation and abdominal cramping for 3 days.  States that she has been getting Depo for [facility], but is not sure when last one was given.  Per patient last pap was 2021 and last HIV test was also in 2021.  Reports that she is interested in talking to someone to get back on her psych meds.  At posting patient reports that she is a contact to [facility] and needs treatment.   See flowsheet for further details and programmatic requirements.    The following portions of the patient's history were reviewed and updated as appropriate: allergies, current medications, past medical history, past social history, past surgical history and problem list.  Objective:  There were no vitals filed for this visit.  Physical Exam Constitutional:      General: She is not in acute distress.    Appearance: Normal appearance.  HENT:     Head: Normocephalic and atraumatic.     Comments: No nits,lice, or hair loss. No cervical, supraclavicular or  axillary adenopathy.    Mouth/Throat:     Mouth: Mucous membranes are moist.     Pharynx: Oropharynx is clear. No oropharyngeal exudate or posterior oropharyngeal erythema.  Eyes:     Conjunctiva/sclera: Conjunctivae normal.  Pulmonary:     Effort: Pulmonary effort is normal.  Abdominal:     Palpations: Abdomen is soft. There is no mass.     Tenderness: There is no abdominal tenderness. There is no guarding or rebound.  Genitourinary:    General: Normal vulva.     Rectum: Normal.     Comments: External genitalia/pubic area without nits, lice, edema, erythema, lesions and inguinal adenopathy. Vagina with normal mucosa and discharge. Cervix without visible lesions. Uterus firm, mobile, nt, no masses, no CMT, no adnexal tenderness or fullness. Musculoskeletal:     Cervical back: Neck supple. No tenderness.  Skin:    General: Skin is warm and dry.     Findings: No bruising, erythema, lesion or rash.  Neurological:     Mental Status: She is alert and oriented to person, place, and time.  Psychiatric:        Mood and Affect: Mood normal.        Behavior: Behavior normal.        Thought Content: Thought content normal.        Judgment: Judgment normal.      Assessment and Plan:  Jacqueline Diaz is a 40 y.o. female presenting to the [facility] Department for STI screening  1. Screening  for STD (sexually transmitted disease) Patient into clinic with symptoms.  Counseled patient that we do not have anyone here to follow her on psych meds and gave patient list with [facility], [facility], and [facility] info to find a mental health provider in this area so that she does not have to go to [facility]. Counseled patient that last Depo was 12/13/2019, and Depo is overdue. Rec condoms with all sex. Await test results.  Counseled that RN will call if needs to RTC for treatment once results are back. - WET PREP FOR TRICH, YEAST, CLUE - Chlamydia/Gonorrhea [place] [facility] - HIV [place] [facility] -  Syphilis Serology, [place] [facility]  2. Gonorrhea contact Treat as a contact to [facility] with Ceftriaxone 500 mg IM and Azithromycin 1 g po DOT today. No sex for 14 days and until after partner completes treatment. Call with questions or concerns. Medications prepared and signed out by me, Ceftriaxone and Azithromycin given to patient by Harvie Heck, RN. - cefTRIAXone (ROCEPHIN) injection 500 mg - azithromycin (ZITHROMAX) tablet 1,000 mg     No follow-ups on file.  No future appointments.  Matt Holmes, PA

## 2020-08-27 ENCOUNTER — Ambulatory Visit: Payer: Medicaid Other

## 2020-08-27 ENCOUNTER — Ambulatory Visit (LOCAL_COMMUNITY_HEALTH_CENTER): Payer: Medicaid Other | Admitting: Physician Assistant

## 2020-08-27 ENCOUNTER — Other Ambulatory Visit: Payer: Self-pay

## 2020-08-27 DIAGNOSIS — Z3009 Encounter for other general counseling and advice on contraception: Secondary | ICD-10-CM | POA: Diagnosis not present

## 2020-08-27 DIAGNOSIS — Z202 Contact with and (suspected) exposure to infections with a predominantly sexual mode of transmission: Secondary | ICD-10-CM

## 2020-08-27 DIAGNOSIS — Z3042 Encounter for surveillance of injectable contraceptive: Secondary | ICD-10-CM

## 2020-08-27 DIAGNOSIS — Z30013 Encounter for initial prescription of injectable contraceptive: Secondary | ICD-10-CM

## 2020-08-27 DIAGNOSIS — Z113 Encounter for screening for infections with a predominantly sexual mode of transmission: Secondary | ICD-10-CM

## 2020-08-27 DIAGNOSIS — A5901 Trichomonal vulvovaginitis: Secondary | ICD-10-CM

## 2020-08-27 LAB — WET PREP FOR TRICH, YEAST, CLUE
Trichomonas Exam: POSITIVE — AB
Yeast Exam: NEGATIVE

## 2020-08-27 MED ORDER — AZITHROMYCIN 500 MG PO TABS
1000.0000 mg | ORAL_TABLET | Freq: Once | ORAL | Status: AC
  Administered 2020-08-27: 1000 mg via ORAL

## 2020-08-27 MED ORDER — METRONIDAZOLE 500 MG PO TABS: 500.0000 mg | ORAL_TABLET | Freq: Two times a day (BID) | ORAL | 0 refills | Status: DC

## 2020-08-27 MED ORDER — METRONIDAZOLE 500 MG PO TABS: 2000.0000 mg | ORAL_TABLET | Freq: Once | ORAL | 0 refills | Status: AC

## 2020-08-27 MED ORDER — CEFTRIAXONE SODIUM 500 MG IJ SOLR
500.0000 mg | Freq: Once | INTRAMUSCULAR | Status: AC
Start: 2020-08-27 — End: 2020-08-27
  Administered 2020-08-27: 500 mg via INTRAMUSCULAR

## 2020-08-27 MED ORDER — MEDROXYPROGESTERONE ACETATE 150 MG/ML IM SUSP
150.0000 mg | Freq: Once | INTRAMUSCULAR | Status: AC
  Administered 2020-08-27: 150 mg via INTRAMUSCULAR

## 2020-08-27 NOTE — Progress Notes (Signed)
Wet mount reviewed, positive for trich, discussed results with provider. Per provider order offered pt #14 7 day supply of Metronidazole for tx of trich, pt refused medication and stated she has had shortness of breath with the medicine and only wants the 4 pills to treat trich (When asked about allergies earlier, pt stated she no longer had any issues metronidazole.) After review of new details by provider, order revised, dispensed #4 Metronidazole and pt was instructed to take Benadryl approx 30-40 minutes before taking Metronidazole; pt counseled that she could take trich tx in a couple of days since she was getting other antibiotics today. Per provider orders pt also treated as contact to [facility] and depo administered. Provider orders completed.

## 2020-08-27 NOTE — Progress Notes (Signed)
Pt states her partner tested positive for GC.

## 2020-08-29 ENCOUNTER — Encounter: Payer: Self-pay | Admitting: Physician Assistant

## 2020-08-29 NOTE — Progress Notes (Signed)
[facility] Department STI clinic/screening visit  Subjective:  Jacqueline Diaz is a 40 y.o. female being seen today for an STI screening visit. The patient reports they do have symptoms.  Patient reports that they do not desire a pregnancy in the next year.   They reported they are interested in discussing contraception today.  No LMP recorded. Patient has had an injection.   Patient has the following medical conditions:   Patient Active Problem List   Diagnosis Date Noted  . Smoker 12/13/2019  . Cocaine abuse (HCC) 12/13/2019  . Marijuana abuse 12/13/2019  . Schizophrenia (HCC) 12/13/2019  . Bipolar 1 disorder (HCC) 12/13/2019  . Hx of syphilis 12/13/2019  . Syphilis 09/05/2019  . 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 SEXUALLY TRANSMITTED DISEASE    screening    HPI  Patient reports that she is a contact to [facility] and has had some stomach cramping for 2-3 days.  Also reports that she has noticed an increase in vaginal discharge for 3 days.  Reports that she has a history of asthma.  Denies regular medicines.  States last HIV test was in 12/2019 when she also had her last pap.  Patient requests that she be able to get/restart her Depo today.     See flowsheet for further details and programmatic requirements.    The following portions of the patient's history were reviewed and updated as appropriate: allergies, current medications, past medical history, past social history, past surgical history and problem list.  Objective:  There were no vitals filed for this visit.  Physical Exam Constitutional:      General: She is not in acute distress.    Appearance: Normal appearance.  HENT:     Head: Normocephalic and atraumatic.     Comments: No nits,lice, or hair loss. No cervical, supraclavicular or axillary adenopathy.    Mouth/Throat:     Mouth: Mucous membranes are  moist.     Pharynx: Oropharynx is clear. No oropharyngeal exudate or posterior oropharyngeal erythema.  Eyes:     Conjunctiva/sclera: Conjunctivae normal.  Pulmonary:     Effort: Pulmonary effort is normal.  Abdominal:     Palpations: Abdomen is soft. There is no mass.     Tenderness: There is no abdominal tenderness. There is no guarding or rebound.  Genitourinary:    General: Normal vulva.     Rectum: Normal.     Comments: External genitalia/pubic area without nits, lice, edema, erythema, lesions and inguinal adenopathy. Vagina with normal mucosa and small amount of white/grayish discharge, pH=>4.5. Cervix without visible lesions. Uterus firm, mobile, nt, no masses, no CMT, no adnexal tenderness or fullness. Musculoskeletal:     Cervical back: Neck supple. No tenderness.  Skin:    General: Skin is warm and dry.     Findings: No bruising, erythema, lesion or rash.  Neurological:     Mental Status: She is alert and oriented to person, place, and time.  Psychiatric:        Mood and Affect: Mood normal.        Behavior: Behavior normal.        Thought Content: Thought content normal.        Judgment: Judgment normal.      Assessment and Plan:  Jacqueline Diaz is a 40 y.o. female presenting to the [facility] Department for STI screening  1. Screening for STD (  sexually transmitted disease) Patient into clinic with symptoms. Rec condoms with all sex. Await test results.  Counseled that RN will call if needs to RTC for treatment once results are back. - WET PREP FOR TRICH, YEAST, CLUE - Gonococcus culture - Chlamydia/Gonorrhea [place] [facility] - HIV [place] [facility] - Syphilis Serology, [place] [facility] - Gonococcus culture  2. Gonorrhea contact Treat patient as a contact to [facility] with Ceftriaxone 500 mg IM and Azithromycin 1 g po DOT today. No sex for 14 days and until after partner completes treatment. RTC for re-treatment if vomits < 2 hr after taking medicine. -  cefTRIAXone (ROCEPHIN) injection 500 mg - azithromycin (ZITHROMAX) tablet 1,000 mg  3. Trichomonal vaginitis Treat Trich with Metronidazole 2 g po stat with food, no EtOH for 24 hr before and until 72 hr after completing medicine. Patient declined Metronidazole for 7 days and stated that she could only take the 4 pills. (See RN note) Enc to use OTC antifungal cream if has itching after taking antibiotics. - metroNIDAZOLE (FLAGYL) 500 MG tablet; Take 4 tablets (2,000 mg total) by mouth once for 1 dose.  Dispense: 4 tablet; Refill: 0  4. Encounter for surveillance of injectable contraceptive Patient has order for Depo from RP appointment in 12/2019. OK to give Depo today per that order. - medroxyPROGESTERone (DEPO-PROVERA) injection 150 mg  5. Encounter for counseling regarding contraception Counseled patient that she should use condoms as a back up for 2 weeks after shot today. Counseled that patient should check an OTC pregnancy test in 2 weeks and RTC if that PT is positive.     Return in about 11 weeks (around 11/12/2020) for for depo and TOC.  No future appointments.  Matt Holmes, PA

## 2020-08-31 LAB — GONOCOCCUS CULTURE

## 2020-09-03 LAB — HM HIV SCREENING LAB: HM HIV Screening: NEGATIVE

## 2021-07-21 IMAGING — CT CT HEAD W/O CM
4 series · 15 of 47 positions shown, 17 images · non-contrast
Comparison: None.
COMPARISON: None.

Addendum:
CLINICAL DATA: Laceration above the right high. Bruising.
Dizziness.

EXAM:
CT HEAD WITHOUT CONTRAST
CT MAXILLOFACIAL WITHOUT CONTRAST
TECHNIQUE: Multidetector CT imaging of the head and maxillofacial structures
were performed using the standard protocol without intravenous
contrast. Multiplanar CT image reconstructions of the maxillofacial
structures were also generated.

[Series 2: head wo · axial · 0.42mm/px · z∈[-223,-108]mm · 7 of 31 slices shown, 9 images]
[im 4/31  brain]
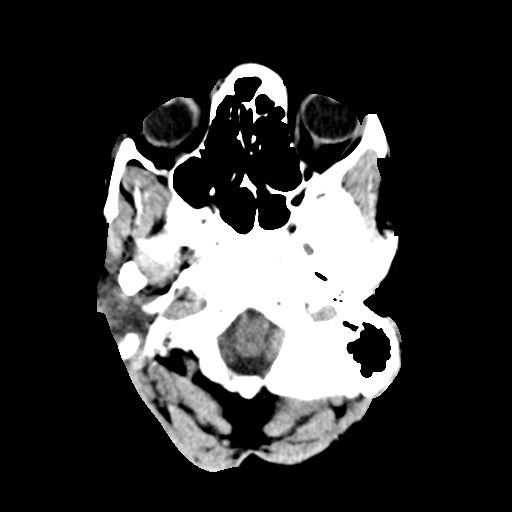
[im 4/31  bone]
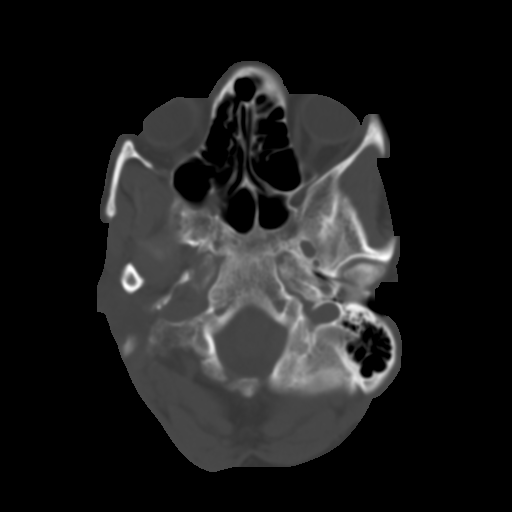
[im 8/31  brain]
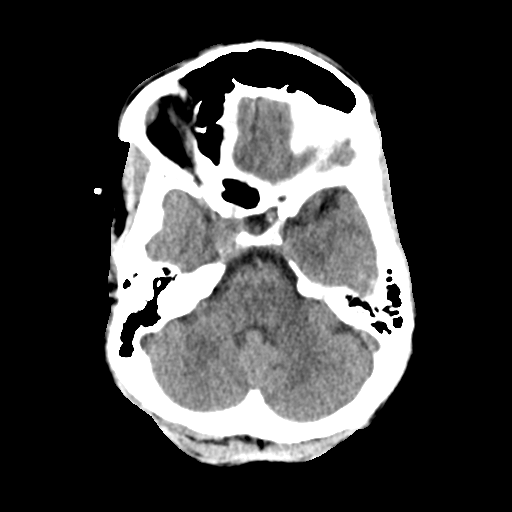
[im 12/31  brain]
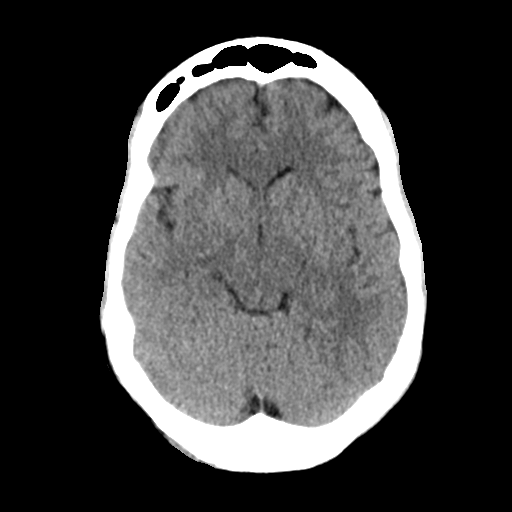
[im 16/31  brain]
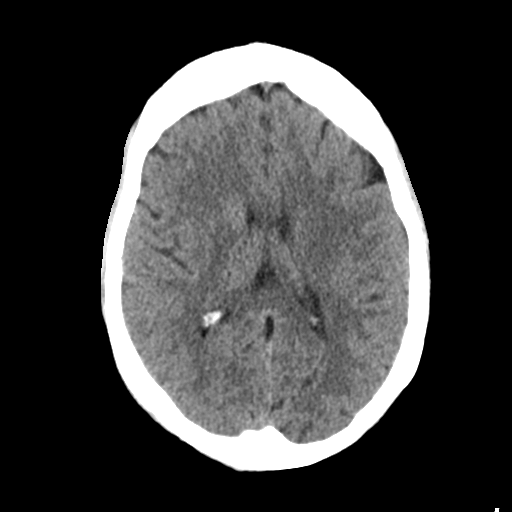
[im 19/31  brain]
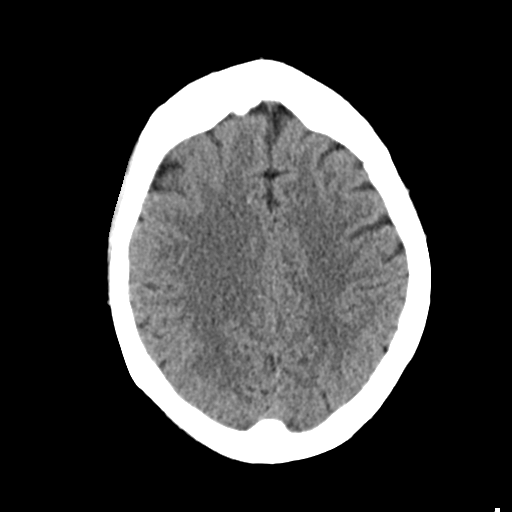
[im 19/31  bone]
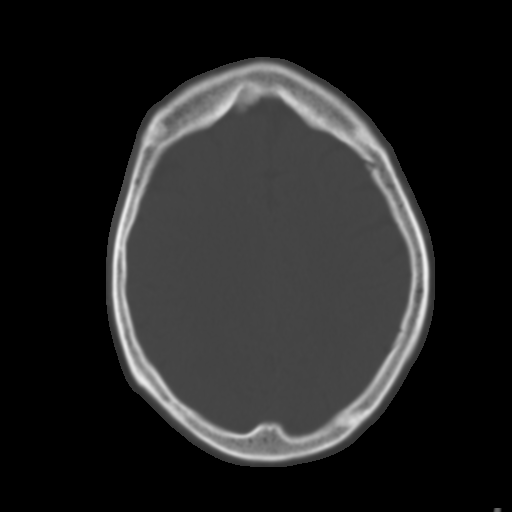
[im 23/31  brain]
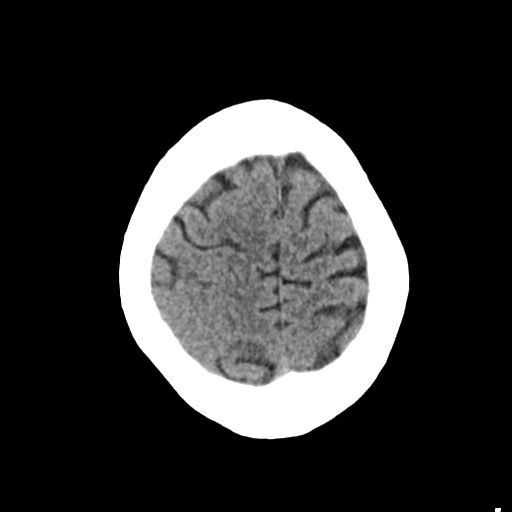
[im 27/31  brain]
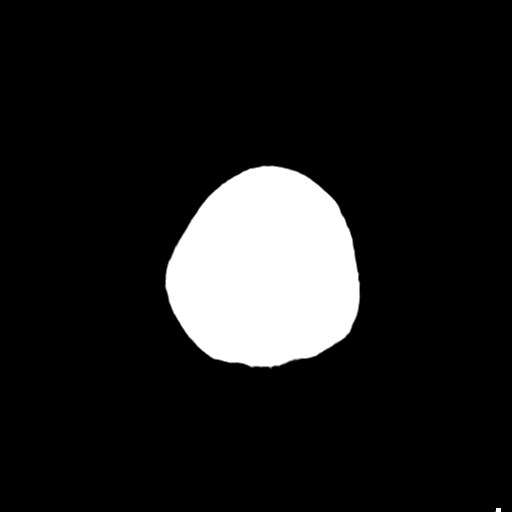

[Series 3: head bone · axial · 0.42mm/px · z∈[-224,-208]mm · 2 of 78 slices shown]
[im 8/78  bone]
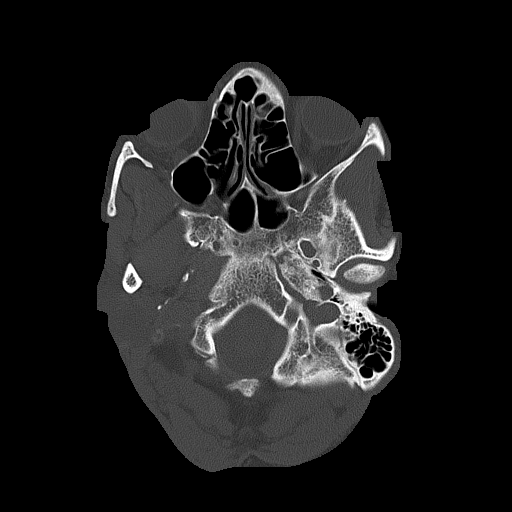
[im 16/78  bone]
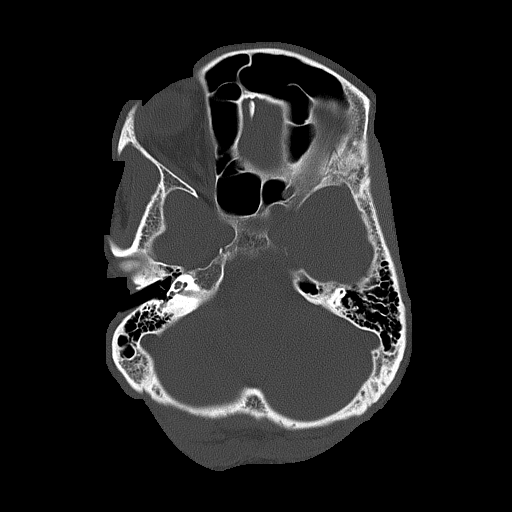

[Series 4: coronal soft tissue · coronal · 0.29mm/px · 3 of 64 slices shown]
[im 22/64  brain]
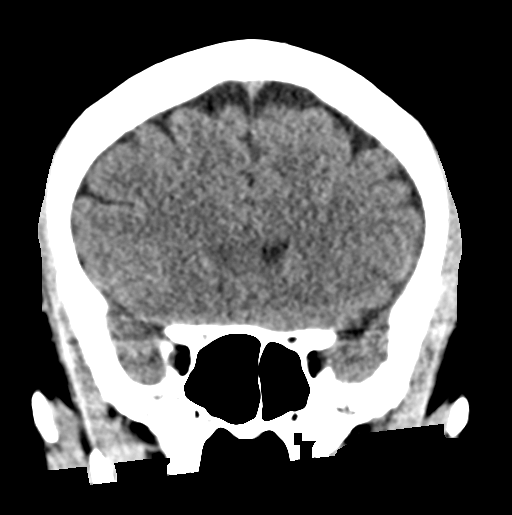
[im 29/64  brain]
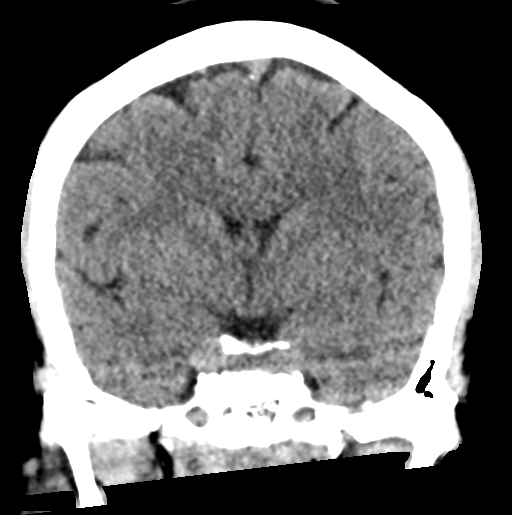
[im 36/64  brain]
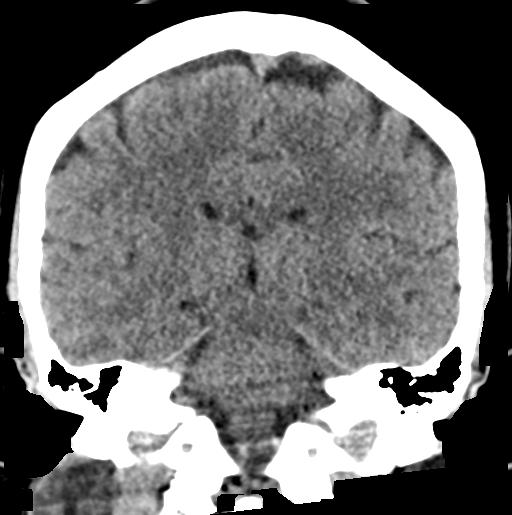

[Series 5: sagittal soft tissue · sagittal · 0.31mm/px · 3 of 47 slices shown]
[im 16/47  brain]
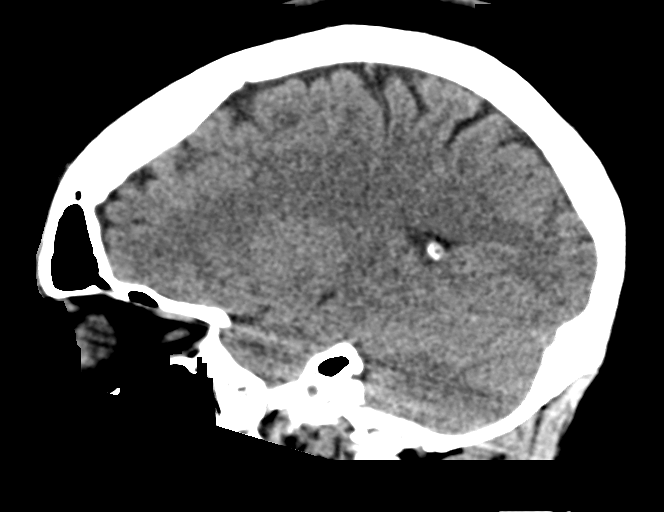
[im 24/47  brain]
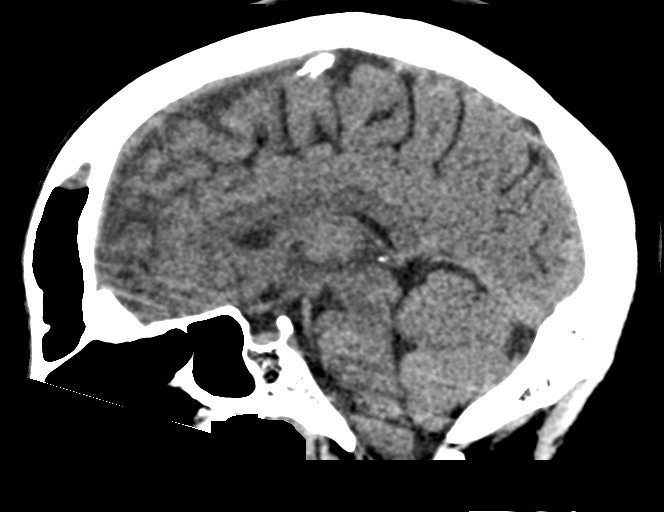
[im 31/47  brain]
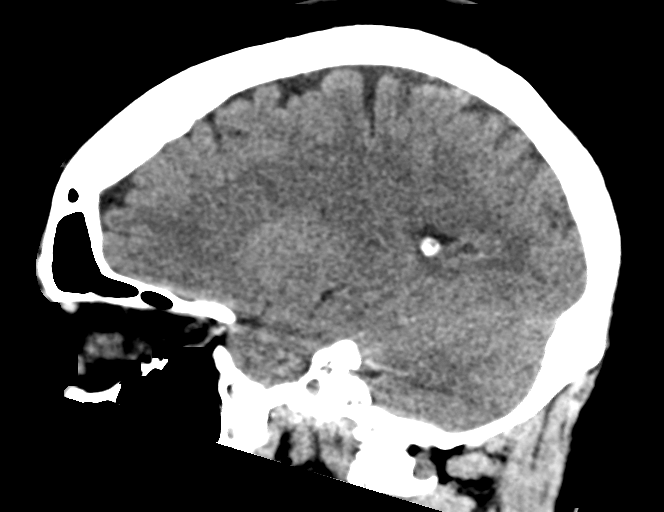

[15 of 47 positions shown; findings below may reference images not displayed]

FINDINGS: CT HEAD FINDINGS

Brain: The brain shows a normal appearance without evidence of
malformation, atrophy, old or acute small or large vessel
infarction, mass lesion, hemorrhage, hydrocephalus or extra-axial
collection.

Vascular: No hyperdense vessel. No evidence of atherosclerotic
calcification.

Skull: Normal.  No traumatic finding.  No focal bone lesion.

Sinuses: Clear except for a retention cyst in the left frontal
sinus.

Other: None significant

CT MAXILLOFACIAL FINDINGS

Osseous: No acute facial fracture. Old orbital floor blowout
fracture on the right with some chronically herniated fat. No
evidence of gross extraocular muscle trapping.

Orbits: No evidence of globe injury. Mild right orbital preseptal
region edema. No postseptal abnormal finding.

Sinuses: Clear except for a retention cyst in the left frontal
sinus.

Soft tissues: Otherwise soft tissue regional structures are normal.
IMPRESSION: Left periorbital preseptal soft tissue injury. No evidence of globe
injury or postseptal orbital injury. No acute facial fracture. Old
right orbital floor blowout fracture with some herniated fat.

No intracranial abnormality.

ADDENDUM:
Several errors in the report. In the clinical data there is a speech
recognition error. Clinical data should read laceration above the
right eye, not right high.

In the impression, I dictated a left/right error. The preseptal
periorbital soft tissue injury is on the right, not the left, as is
obvious clinically.

*** End of Addendum ***
FINDINGS: CT HEAD FINDINGS

Brain: The brain shows a normal appearance without evidence of
malformation, atrophy, old or acute small or large vessel
infarction, mass lesion, hemorrhage, hydrocephalus or extra-axial
collection.

Vascular: No hyperdense vessel. No evidence of atherosclerotic
calcification.

Skull: Normal.  No traumatic finding.  No focal bone lesion.

Sinuses: Clear except for a retention cyst in the left frontal
sinus.

Other: None significant

CT MAXILLOFACIAL FINDINGS

Osseous: No acute facial fracture. Old orbital floor blowout
fracture on the right with some chronically herniated fat. No
evidence of gross extraocular muscle trapping.

Orbits: No evidence of globe injury. Mild right orbital preseptal
region edema. No postseptal abnormal finding.

Sinuses: Clear except for a retention cyst in the left frontal
sinus.

Soft tissues: Otherwise soft tissue regional structures are normal.
IMPRESSION: Left periorbital preseptal soft tissue injury. No evidence of globe
injury or postseptal orbital injury. No acute facial fracture. Old
right orbital floor blowout fracture with some herniated fat.

No intracranial abnormality.

## 2021-07-21 IMAGING — CT CT MAXILLOFACIAL W/O CM
3 series · 14 of 47 positions shown, 16 images · non-contrast
Comparison: None.
COMPARISON: None.

Addendum:
CLINICAL DATA: Laceration above the right high. Bruising.
Dizziness.

EXAM:
CT HEAD WITHOUT CONTRAST
CT MAXILLOFACIAL WITHOUT CONTRAST
TECHNIQUE: Multidetector CT imaging of the head and maxillofacial structures
were performed using the standard protocol without intravenous
contrast. Multiplanar CT image reconstructions of the maxillofacial
structures were also generated.

[Series 2: max soft · axial · 0.35mm/px · z∈[-317,-171]mm · 8 of 85 slices shown, 10 images]
[im 6/85  brain]
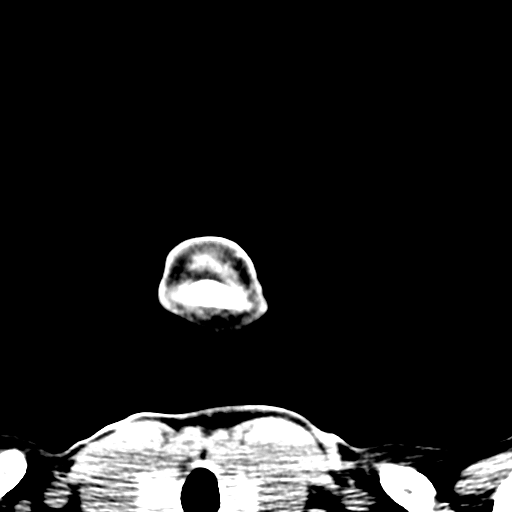
[im 6/85  bone]
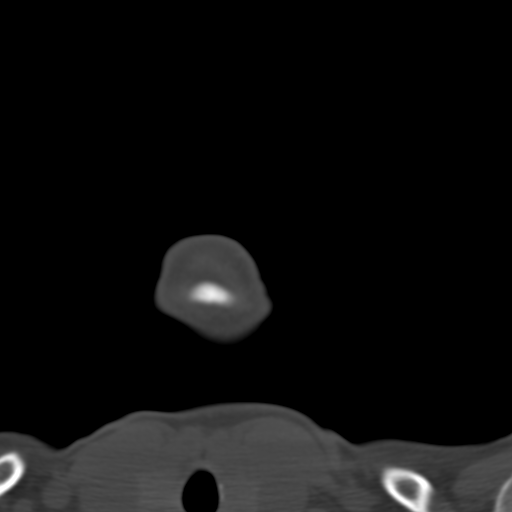
[im 18/85  bone]
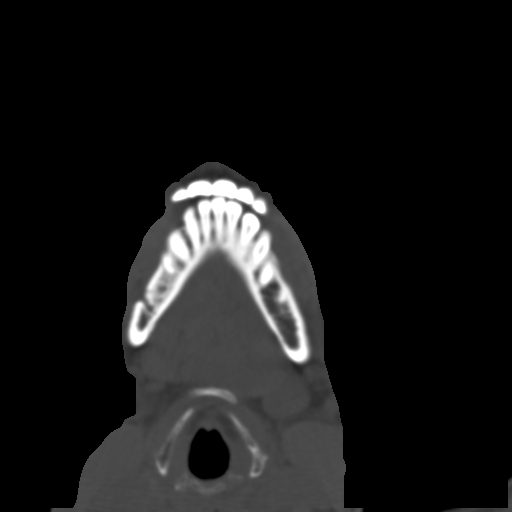
[im 27/85  bone]
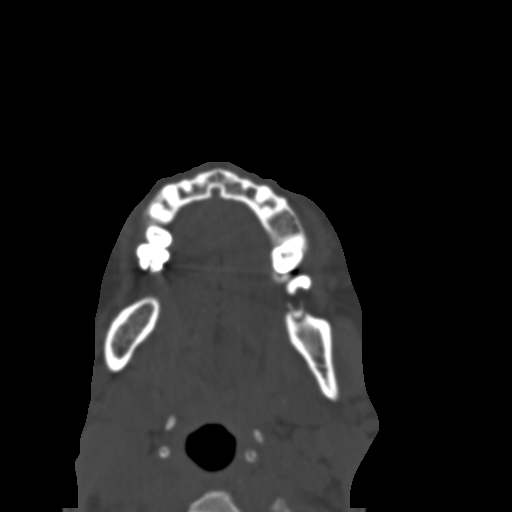
[im 38/85  bone]
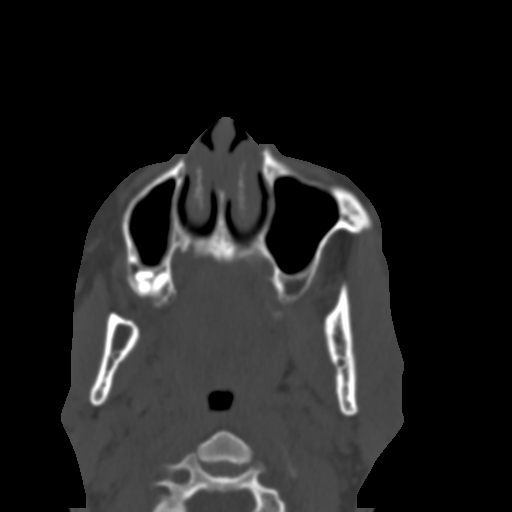
[im 47/85  brain]
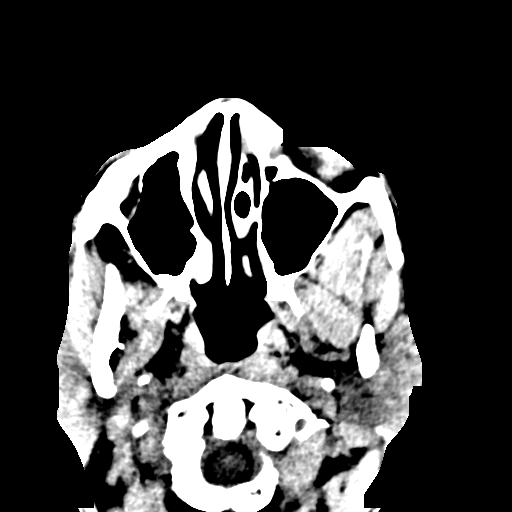
[im 47/85  bone]
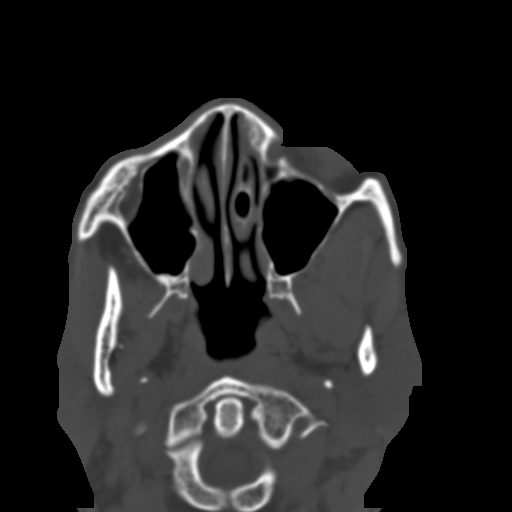
[im 58/85  bone]
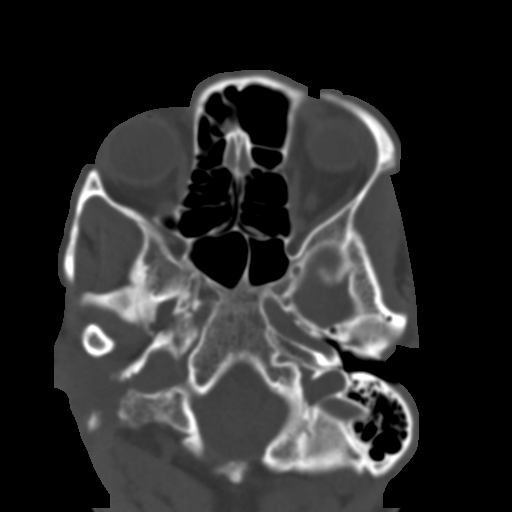
[im 67/85  bone]
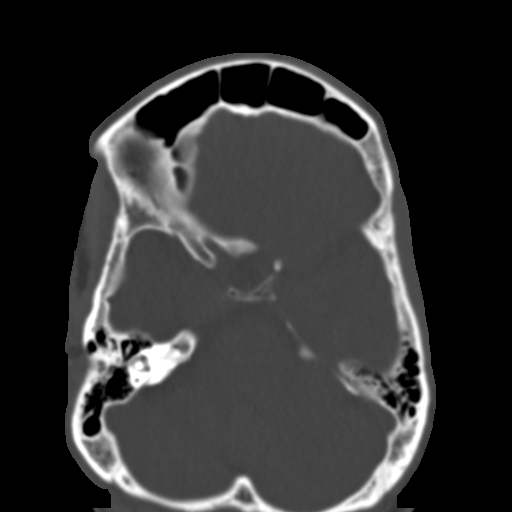
[im 79/85  bone]
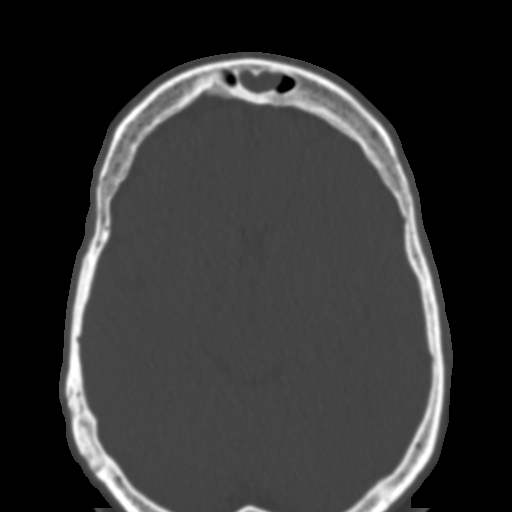

[Series 6: coronal soft · coronal · 0.30mm/px · 3 of 62 slices shown]
[im 21/62  bone]
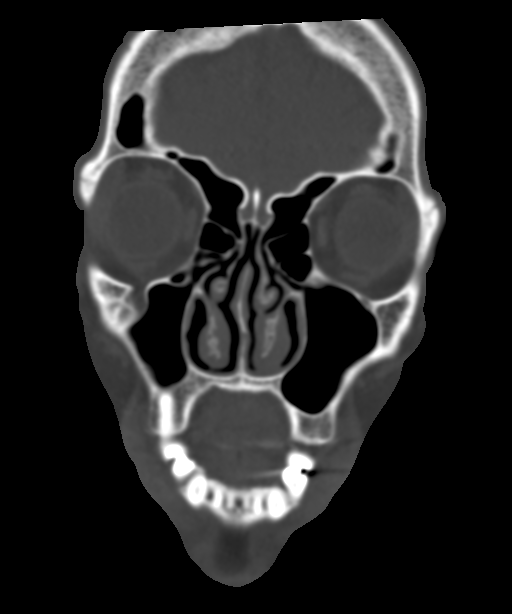
[im 28/62  bone]
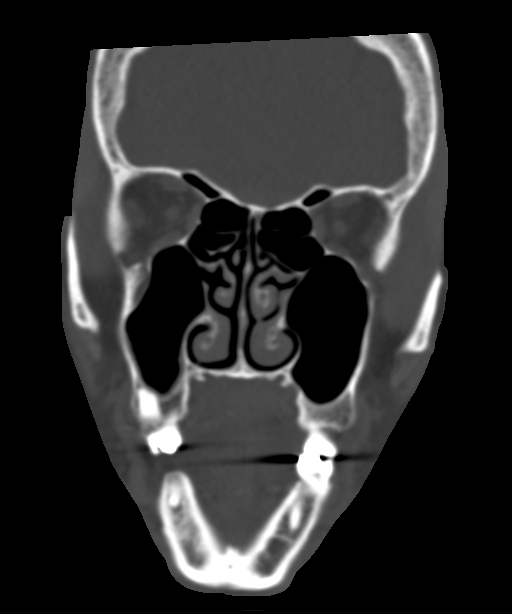
[im 34/62  bone]
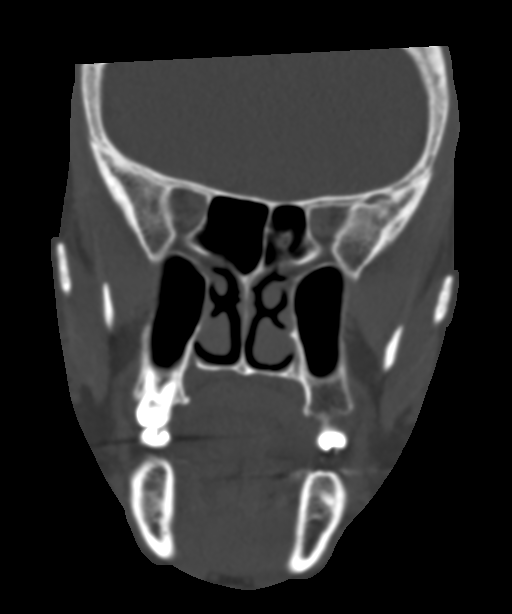

[Series 7: sagittal soft · sagittal · 0.27mm/px · 3 of 68 slices shown]
[im 23/68  bone]
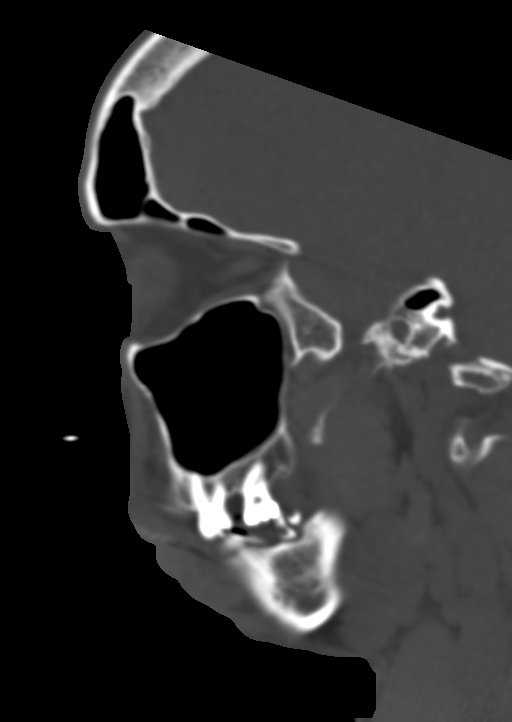
[im 34/68  bone]
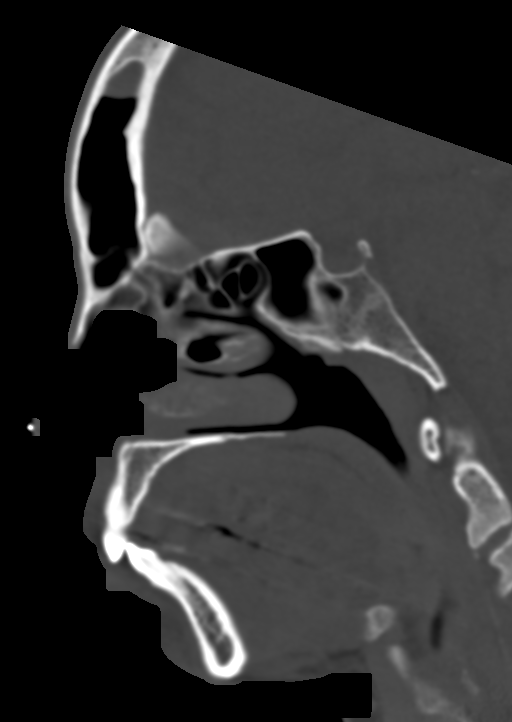
[im 45/68  bone]
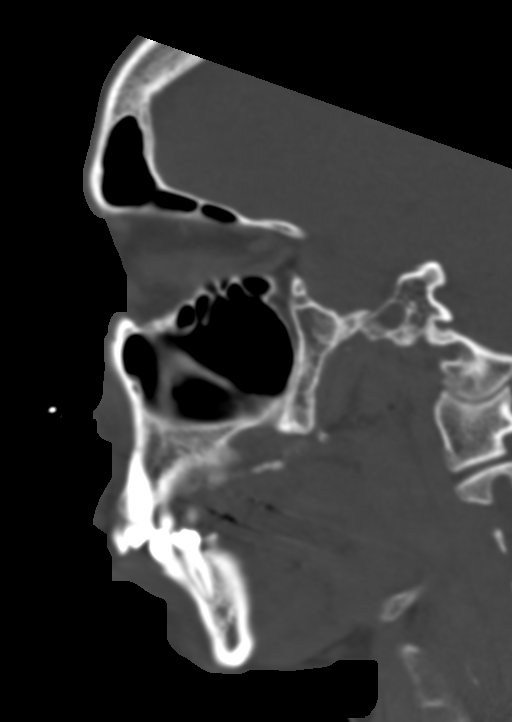

[14 of 47 positions shown; findings below may reference images not displayed]

FINDINGS: CT HEAD FINDINGS

Brain: The brain shows a normal appearance without evidence of
malformation, atrophy, old or acute small or large vessel
infarction, mass lesion, hemorrhage, hydrocephalus or extra-axial
collection.

Vascular: No hyperdense vessel. No evidence of atherosclerotic
calcification.

Skull: Normal.  No traumatic finding.  No focal bone lesion.

Sinuses: Clear except for a retention cyst in the left frontal
sinus.

Other: None significant

CT MAXILLOFACIAL FINDINGS

Osseous: No acute facial fracture. Old orbital floor blowout
fracture on the right with some chronically herniated fat. No
evidence of gross extraocular muscle trapping.

Orbits: No evidence of globe injury. Mild right orbital preseptal
region edema. No postseptal abnormal finding.

Sinuses: Clear except for a retention cyst in the left frontal
sinus.

Soft tissues: Otherwise soft tissue regional structures are normal.
IMPRESSION: Left periorbital preseptal soft tissue injury. No evidence of globe
injury or postseptal orbital injury. No acute facial fracture. Old
right orbital floor blowout fracture with some herniated fat.

No intracranial abnormality.

ADDENDUM:
Several errors in the report. In the clinical data there is a speech
recognition error. Clinical data should read laceration above the
right eye, not right high.

In the impression, I dictated a left/right error. The preseptal
periorbital soft tissue injury is on the right, not the left, as is
obvious clinically.

*** End of Addendum ***
FINDINGS: CT HEAD FINDINGS

Brain: The brain shows a normal appearance without evidence of
malformation, atrophy, old or acute small or large vessel
infarction, mass lesion, hemorrhage, hydrocephalus or extra-axial
collection.

Vascular: No hyperdense vessel. No evidence of atherosclerotic
calcification.

Skull: Normal.  No traumatic finding.  No focal bone lesion.

Sinuses: Clear except for a retention cyst in the left frontal
sinus.

Other: None significant

CT MAXILLOFACIAL FINDINGS

Osseous: No acute facial fracture. Old orbital floor blowout
fracture on the right with some chronically herniated fat. No
evidence of gross extraocular muscle trapping.

Orbits: No evidence of globe injury. Mild right orbital preseptal
region edema. No postseptal abnormal finding.

Sinuses: Clear except for a retention cyst in the left frontal
sinus.

Soft tissues: Otherwise soft tissue regional structures are normal.
IMPRESSION: Left periorbital preseptal soft tissue injury. No evidence of globe
injury or postseptal orbital injury. No acute facial fracture. Old
right orbital floor blowout fracture with some herniated fat.

No intracranial abnormality.

## 2021-09-29 ENCOUNTER — Ambulatory Visit: Payer: Self-pay | Admitting: Physician Assistant

## 2021-09-29 ENCOUNTER — Encounter: Payer: Self-pay | Admitting: Physician Assistant

## 2021-09-29 DIAGNOSIS — Z113 Encounter for screening for infections with a predominantly sexual mode of transmission: Secondary | ICD-10-CM

## 2021-09-29 LAB — WET PREP FOR TRICH, YEAST, CLUE
Trichomonas Exam: NEGATIVE
Yeast Exam: NEGATIVE

## 2021-09-29 LAB — HEPATITIS B SURFACE ANTIGEN: Hepatitis B Surface Ag: NONREACTIVE

## 2021-09-29 LAB — HM HIV SCREENING LAB: HM HIV Screening: NEGATIVE

## 2021-09-29 NOTE — Progress Notes (Signed)
Wet prep reviewed and Clinical research associate verified that treated for BV with A. Streilein PA-C and B. Michele Mcalpine. Jossie Ng, RN

## 2021-09-29 NOTE — Progress Notes (Signed)
[facility] Department  STI clinic/screening visit [address] [place] [address] [phone]  Subjective:  Jacqueline Diaz is a 41 y.o. female being seen today for an STI screening visit. The patient reports they do have symptoms.  Patient reports that they do not desire a pregnancy in the next year.   They reported they are not interested in discussing contraception today.    Patient's last menstrual period was 09/23/2021 (exact date).   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 Pt reports urgent care eval of same 2-3 wk ago, dx with BV and UTI and told to take nitrofurantoin and something else. She reports she completed those at least 2 weeks prior to this visit. LMP approx 09/18/21. Not on BCM, has annual physical sched here in 3 days.   - see STD flowsheet.  Patient reports 2 weeks of vaginal and urinary symptoms.  Last HIV test per patient/review of record was 08/11/21 and was neg. Patient reports last pap was .   Screening for MPX risk: Does the patient have an unexplained rash? No Is the patient MSM? No Does the patient endorse multiple sex partners or anonymous sex partners? Yes Did the patient have close or sexual contact with a person diagnosed with MPX? No Has the patient traveled outside the [place] where MPX is endemic? No Is there a high clinical suspicion for MPX-- evidenced by one of the following No  -Unlikely to be  chickenpox  -Lymphadenopathy  -Rash that present in same phase of evolution on any given body part See flowsheet for further details and programmatic requirements.   Immunization history:  Immunization History  Administered Date(s) Administered   Influenza,inj,Quad PF,6+ Mos 04/02/2017   Influenza-Unspecified 01/10/2013, 04/25/2020   Tdap 01/10/2013, 07/21/2017     The following portions of the patient's history were reviewed and updated as appropriate: allergies, current medications, past medical history, past social history, past surgical history and problem list.  Objective:  There were no vitals filed for this visit.  Physical Exam Vitals and nursing note reviewed.  Constitutional:      Appearance: Normal appearance.  HENT:     Head: Normocephalic and atraumatic.     Mouth/Throat:     Mouth: Mucous membranes are moist.     Pharynx: Oropharynx is clear. No oropharyngeal exudate or posterior oropharyngeal erythema.  Pulmonary:     Effort: Pulmonary effort is normal.  Abdominal:     General: Abdomen is flat.     Palpations: There is no mass.     Tenderness: There is no abdominal tenderness. There is no rebound.  Genitourinary:    General: Normal vulva.     Exam position: Lithotomy position.     Pubic Area: No rash or pubic lice.      Labia:        Right: No rash or lesion.        Left: No rash or lesion.      Vagina:  Vaginal discharge present. No erythema, bleeding or lesions.     Cervix: No cervical motion tenderness, discharge, friability, lesion or erythema.     Uterus: Normal.      Adnexa: Right adnexa normal and left adnexa normal.     Rectum: Normal.     Comments: pH = <4.5, adherent creamy discharge, malodorous. 1x2 cm NT soft nodule L lower introitus at site of former cyst excision Musculoskeletal:     Cervical back: Neck supple.  Lymphadenopathy:     Head:     Right side of head: No preauricular or posterior auricular adenopathy.     Left side of head: No  preauricular or posterior auricular adenopathy.     Cervical: No cervical adenopathy.     Upper Body:     Right upper body: No supraclavicular, axillary or epitrochlear adenopathy.     Left upper body: No supraclavicular, axillary or epitrochlear adenopathy.     Lower Body: No right inguinal adenopathy. No left inguinal adenopathy.  Skin:    General: Skin is warm and dry.     Findings: No rash.  Neurological:     Mental Status: She is alert and oriented to person, place, and time.      Assessment and Plan:  Jacqueline Diaz is a 41 y.o. female presenting to the [facility] Department for STI screening  1. Routine screening for STI (sexually transmitted infection) Probable bacterial vaginosis, treat with metronidazole 500mg  po bid for 7 days. Recommend primary care eval for possible urinary tract infection if sx worsen or not improving in next several days (and all pending tests are negative.) Please give list of local primary care providers and enc pt to keep annual well-woman exam as sched later this week. - WET PREP FOR TRICH, YEAST, CLUE - HIV [place] [facility] - Syphilis Serology, [place] [facility] - Gonococcus culture - rectal - HBV Antigen/[facility] [place] [facility]  Return in about 3 months (around 12/30/2021) for STD eval.  Future Appointments  Date Time Provider Department Center  10/02/2021 10:00 AM [facility] PROVIDER [facility] None    10/04/2021, PA-C

## 2021-09-29 NOTE — Progress Notes (Signed)
The patient was dispensed Metronidazole 500 mg x 7 days. Provider  approved oral dosage. Patient stated she would take oral dose instead of vaginal cream. I provided counseling today regarding the medication. We discussed the medication, the side effects and when to call clinic. Patient given the opportunity to ask questions. Questions answered. Delynn Flavin RN

## 2021-10-02 ENCOUNTER — Ambulatory Visit: Payer: BLUE CROSS/BLUE SHIELD

## 2021-10-04 LAB — GONOCOCCUS CULTURE

## 2021-10-22 NOTE — Addendum Note (Signed)
Addended by: Geanie Berlin on: 10/22/2021 04:03 PM   Modules accepted: Orders

## 2022-09-23 ENCOUNTER — Encounter: Payer: Self-pay | Admitting: Family Medicine

## 2022-09-23 ENCOUNTER — Ambulatory Visit: Payer: Medicaid Other | Admitting: Family Medicine

## 2022-09-23 ENCOUNTER — Ambulatory Visit (LOCAL_COMMUNITY_HEALTH_CENTER): Payer: Medicaid Other | Admitting: Family Medicine

## 2022-09-23 VITALS — BP 123/85 | HR 66 | Ht 65.0 in | Wt 138.2 lb

## 2022-09-23 VITALS — Temp 98.0°F

## 2022-09-23 DIAGNOSIS — Z30013 Encounter for initial prescription of injectable contraceptive: Secondary | ICD-10-CM | POA: Diagnosis not present

## 2022-09-23 DIAGNOSIS — A599 Trichomoniasis, unspecified: Secondary | ICD-10-CM

## 2022-09-23 DIAGNOSIS — Z113 Encounter for screening for infections with a predominantly sexual mode of transmission: Secondary | ICD-10-CM

## 2022-09-23 DIAGNOSIS — Z3009 Encounter for other general counseling and advice on contraception: Secondary | ICD-10-CM | POA: Diagnosis not present

## 2022-09-23 DIAGNOSIS — F333 Major depressive disorder, recurrent, severe with psychotic symptoms: Secondary | ICD-10-CM

## 2022-09-23 DIAGNOSIS — B9689 Other specified bacterial agents as the cause of diseases classified elsewhere: Secondary | ICD-10-CM

## 2022-09-23 DIAGNOSIS — Z3042 Encounter for surveillance of injectable contraceptive: Secondary | ICD-10-CM

## 2022-09-23 LAB — WET PREP FOR TRICH, YEAST, CLUE
Trichomonas Exam: POSITIVE — AB
Yeast Exam: NEGATIVE

## 2022-09-23 LAB — HM HIV SCREENING LAB: HM HIV Screening: NEGATIVE

## 2022-09-23 LAB — HM HEPATITIS C SCREENING LAB: HM Hepatitis Screen: NEGATIVE

## 2022-09-23 MED ORDER — MEDROXYPROGESTERONE ACETATE 150 MG/ML IM SUSP
150.0000 mg | Freq: Once | INTRAMUSCULAR | Status: AC
Start: 2022-09-23 — End: 2022-09-23
  Administered 2022-09-23: 150 mg via INTRAMUSCULAR

## 2022-09-23 MED ORDER — METRONIDAZOLE 500 MG PO TABS: 500.0000 mg | ORAL_TABLET | Freq: Two times a day (BID) | ORAL | 0 refills | Status: AC

## 2022-09-23 NOTE — Progress Notes (Signed)
IM given in L. Glute, pt tolerated well and given card for next injection.

## 2022-09-23 NOTE — Addendum Note (Signed)
Addended by: Lenice Llamas on: 09/23/2022 10:27 AM   Modules accepted: Orders

## 2022-09-23 NOTE — Progress Notes (Signed)
Wet prep results reviewed with patient.  The patient was dispensed metronidazole today. I provided counseling today regarding the medication. We discussed the medication, the side effects and when to call clinic. Patient given the opportunity to ask questions. Questions answered.  Condoms given.

## 2022-09-23 NOTE — Progress Notes (Addendum)
[facility] Department  STI clinic/screening visit [address] [place] [address] [phone]  Subjective:  Jacqueline Diaz is a 42 y.o. female being seen today for an STI screening visit. The patient reports they do have symptoms.  Patient reports that they do desire a pregnancy in the next year.   They reported they are interested in discussing contraception today.    Patient's last menstrual period was 09/09/2022 (exact date).  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 States she has a 1 week hx of lower abdominal pain  Does the patient using douching products? No  Last HIV test per patient/review of record was  Lab Results  Component Value Date   HMHIVSCREEN Negative - Validated 09/29/2021   No results found for: "HIV" Patient reports last pap was No results found for: "DIAGPAP"  Lab Results  Component Value Date   SPECADGYN Comment 12/13/2019    Screening for MPX risk: Does the patient have an unexplained rash? No Is the patient MSM? No Does the patient endorse multiple sex partners or anonymous sex partners? No Did the patient have close or sexual contact with a person diagnosed with MPX? No Has the patient traveled outside the [place] where MPX is endemic? No Is there a high clinical suspicion for MPX-- evidenced by one of the following No  -Unlikely to be chickenpox  -Lymphadenopathy  -Rash that present in same phase of  evolution on any given body part See flowsheet for further details and programmatic requirements.   Immunization history:  Immunization History  Administered Date(s) Administered   Hep A / Hep B 07/11/2002, 09/01/2002, 03/13/2003   Influenza,inj,Quad PF,6+ Mos 04/02/2017   Influenza-Unspecified 01/10/2013, 04/25/2020   Tdap 01/10/2013, 07/21/2017     The following portions of the patient's history were reviewed and updated as appropriate: allergies, current medications, past medical history, past social history, past surgical history and problem list.  Objective:   Vitals:   09/23/22 0943  Temp: 98 F (36.7 C)  TempSrc: Oral    Physical Exam Vitals and nursing note reviewed.  Constitutional:      Appearance: Normal appearance.  HENT:     Head: Normocephalic and atraumatic.     Mouth/Throat:     Mouth: Mucous membranes are moist.     Pharynx: Oropharynx is clear. No oropharyngeal exudate or posterior oropharyngeal erythema.  Pulmonary:     Effort: Pulmonary effort is normal.  Abdominal:     General: Abdomen is flat.     Palpations: There is no mass.     Tenderness: There is no abdominal tenderness. There is no rebound.  Genitourinary:    Exam position: Lithotomy position.     Pubic Area: No rash or pubic lice.      Labia:        Right: No rash or lesion.        Left: No rash or lesion.  Vagina: Vaginal discharge present. No erythema, bleeding or lesions.     Cervix: No cervical motion tenderness, discharge, friability, lesion or erythema.     Uterus: Normal.      Adnexa: Right adnexa normal and left adnexa normal.     Rectum: Normal.       Comments: pH = 4-5  Moderate amt of white discharge present  Thick scar tissue present on left labia- pt reports previous cyst with surgery at this site  Lymphadenopathy:     Head:     Right side of head: No preauricular or posterior auricular adenopathy.     Left side of head: No preauricular or posterior auricular  adenopathy.     Cervical: No cervical adenopathy.     Upper Body:     Right upper body: No supraclavicular, axillary or epitrochlear adenopathy.     Left upper body: No supraclavicular, axillary or epitrochlear adenopathy.     Lower Body: No right inguinal adenopathy. No left inguinal adenopathy.  Skin:    General: Skin is warm and dry.     Findings: No rash.  Neurological:     Mental Status: She is alert and oriented to person, place, and time.      Assessment and Plan:  Jacqueline Diaz is a 42 y.o. female presenting to the [facility] Department for STI screening  1. Screening for venereal disease  - Chlamydia/Gonorrhea [place] - Syphilis Serology, [facility] - HIV/HCV [facility] - Chlamydia/Gonorrhea [place] [facility] - Chlamydia/Gonorrhea [place] [facility] - WET PREP FOR TRICH, YEAST, CLUE  2. MDD (major depressive disorder), recurrent, severe, with psychosis (HCC) Requests therapy referral  - Ambulatory referral to Behavioral Health  3. Trichomoniasis -Pt has metro listed as an allergy with previous reported hives and SOB.Prescribed metro 2x in 2023 (January and November) without adverse reaction. Counseled when to go to ER  - metroNIDAZOLE (FLAGYL) 500 MG tablet; Take 1 tablet (500 mg total) by mouth 2 (two) times daily for 7 days.  Dispense: 14 tablet; Refill: 0  4. Bacterial vaginosis  - metroNIDAZOLE (FLAGYL) 500 MG tablet; Take 1 tablet (500 mg total) by mouth 2 (two) times daily for 7 days.  Dispense: 14 tablet; Refill: 0   Patient accepted all screenings including oral, vaginal CT/GC and bloodwork for HIV/RPR, and wet prep. Patient meets criteria for HepB screening? No. Ordered? not applicable Patient meets criteria for HepC screening? Yes. Ordered? yes  Treat wet prep per standing order Discussed time line for [facility] results and that patient will be called with positive results and encouraged patient to call if she had not heard in 2  weeks.  Counseled to return or seek care for continued or worsening symptoms Recommended repeat testing in 3 months with positive results. Recommended condom use with all sex  Patient is currently using  nothing  to prevent pregnancy.    No follow-ups on file.  Future Appointments  Date Time Provider Department Center  09/23/2022 10:30 AM [facility] PROVIDER [facility] None  10/22/2022  8:35 AM [facility]FP PROVIDER [facility] None   Total time spent 20 minutes  Lenice Llamas, FNP

## 2022-09-23 NOTE — Progress Notes (Addendum)
Wet prep results reviewed with patient.  The patient was dispensed metronidazole today. I provided counseling today regarding the medication. We discussed the medication, the side effects and when to call clinic. Patient given the opportunity to ask questions. Questions answered.  Condoms given.Pt counseled to report to ER for s/s of allergic reaction such as hives, sob, throat swelling.

## 2022-09-23 NOTE — Progress Notes (Signed)
   [facility]  Subjective:  Jacqueline Diaz is a 42 y.o. being seen today for depo. Pt reports being homeless, having unprotected sex with a LMP of 09/09/22, reports no sex since LMP. Wants depo started today. In for STI testing- see other chart.   No chief complaint on file.   HPI   Does the patient have a current or past history of drug use? Yes   No components found for: "HCV"]   Health Maintenance Due  Topic Date Due   COVID-19 Vaccine (1) Never done    ROS  The following portions of the patient's history were reviewed and updated as appropriate: allergies, current medications, past family history, past medical history, past social history, past surgical history and problem list. Problem list updated.   See flowsheet for other program required questions.  Objective:  There were no vitals filed for this visit.  Physical Exam  See other chart documentation  Assessment and Plan:  Jacqueline Diaz is a 42 y.o. female presenting to the [facility] Department for a Women's Health problem visit  1. Encounter for Depo-Provera contraception One time depo administered today -counseled to make a FP appt   - medroxyPROGESTERone (DEPO-PROVERA) injection 150 mg     No follow-ups on file.  Future Appointments  Date Time Provider Department Center  10/22/2022  8:35 AM [facility] PROVIDER [facility] None   Total time spent 10 minutes Lenice Llamas, [place]

## 2022-09-30 ENCOUNTER — Telehealth: Payer: Self-pay

## 2022-09-30 NOTE — Telephone Encounter (Signed)
LM for pt to return my call

## 2022-10-01 ENCOUNTER — Telehealth: Payer: Self-pay

## 2022-10-01 ENCOUNTER — Ambulatory Visit: Payer: Medicaid Other

## 2022-10-01 DIAGNOSIS — A749 Chlamydial infection, unspecified: Secondary | ICD-10-CM

## 2022-10-01 MED ORDER — DOXYCYCLINE HYCLATE 100 MG PO TABS
100.0000 mg | ORAL_TABLET | Freq: Two times a day (BID) | ORAL | 0 refills | Status: AC
Start: 2022-10-01 — End: 2022-10-08

## 2022-10-01 NOTE — Progress Notes (Signed)
Pt is here for treatment of Chlamydia.  Pt is using Depo for a birth control method.  LMP was 09/09/2022. The patient was dispensed Doxycycline 100 mg #14 today. I provided counseling today regarding the medication. We discussed the medication, the side effects and when to call clinic. Patient given the opportunity to ask questions. Questions answered.  Condoms given.   Pt stated that she is still taking her Metronidazole.  Medication scheduled to be take 6/12 - 6/19.  Pt stated she doesn't take the Metronidazole correctly "cuz it makes me sick."  Pt informed to finish her Metronidazole and start and finish her Doxycyline.  Berdie Ogren, RN

## 2022-10-01 NOTE — Telephone Encounter (Signed)
Called pt, she answered and said "please quit calling this number" and hung up.

## 2022-10-01 NOTE — Telephone Encounter (Signed)
LM for pt to return my call

## 2022-10-22 ENCOUNTER — Ambulatory Visit: Payer: MEDICAID
